# Patient Record
Sex: Male | Born: 1990 | Race: White | Hispanic: No | Marital: Married | Smoking: Never smoker
Health system: Southern US, Community
[De-identification: ages and names within clinical notes are randomized; demographics above are authoritative.]

## PROBLEM LIST (undated history)

## (undated) DIAGNOSIS — F419 Anxiety disorder, unspecified: Secondary | ICD-10-CM

## (undated) DIAGNOSIS — I10 Essential (primary) hypertension: Secondary | ICD-10-CM

## (undated) DIAGNOSIS — M1990 Unspecified osteoarthritis, unspecified site: Secondary | ICD-10-CM

## (undated) DIAGNOSIS — N200 Calculus of kidney: Secondary | ICD-10-CM

## (undated) DIAGNOSIS — E559 Vitamin D deficiency, unspecified: Secondary | ICD-10-CM

## (undated) DIAGNOSIS — E785 Hyperlipidemia, unspecified: Secondary | ICD-10-CM

## (undated) DIAGNOSIS — G4730 Sleep apnea, unspecified: Secondary | ICD-10-CM

## (undated) DIAGNOSIS — M109 Gout, unspecified: Secondary | ICD-10-CM

## (undated) DIAGNOSIS — E781 Pure hyperglyceridemia: Secondary | ICD-10-CM

## (undated) HISTORY — DX: Gout, unspecified: M10.9

## (undated) HISTORY — DX: Calculus of kidney: N20.0

## (undated) HISTORY — DX: Anxiety disorder, unspecified: F41.9

## (undated) HISTORY — DX: Unspecified osteoarthritis, unspecified site: M19.90

## (undated) HISTORY — DX: Sleep apnea, unspecified: G47.30

## (undated) HISTORY — PX: APPENDECTOMY: SHX54

## (undated) HISTORY — DX: Vitamin D deficiency, unspecified: E55.9

## (undated) HISTORY — PX: NASAL SEPTUM SURGERY: SHX37

## (undated) HISTORY — DX: Pure hyperglyceridemia: E78.1

## (undated) HISTORY — PX: TURBINATE RESECTION: SHX293

---
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
## 2011-04-24 ENCOUNTER — Encounter: Payer: Self-pay | Admitting: Emergency Medicine

## 2011-04-24 ENCOUNTER — Emergency Department (INDEPENDENT_AMBULATORY_CARE_PROVIDER_SITE_OTHER)
Admission: EM | Admit: 2011-04-24 | Discharge: 2011-04-24 | Disposition: A | Discharge status: Home / Self Care | Source: Home / Self Care | Attending: Emergency Medicine | Admitting: Emergency Medicine

## 2011-04-24 DIAGNOSIS — J069 Acute upper respiratory infection, unspecified: Secondary | ICD-10-CM

## 2011-04-24 DIAGNOSIS — J029 Acute pharyngitis, unspecified: Secondary | ICD-10-CM

## 2011-04-24 HISTORY — DX: Essential (primary) hypertension: I10

## 2011-04-24 LAB — POCT RAPID STREP A (OFFICE): Rapid Strep A Screen: NEGATIVE

## 2011-04-24 MED ORDER — AZITHROMYCIN 250 MG PO TABS: ORAL_TABLET | ORAL | Status: AC

## 2011-04-24 NOTE — ED Provider Notes (Signed)
History     CSN: [id number]  Arrival date & time 04/24/11  1354   First MD Initiated Contact with Patient 04/24/11 1413      No chief complaint on file.   (Consider location/radiation/quality/duration/timing/severity/associated sxs/prior treatment) HPI Ricky Dalton is a 21 y.o. male who complains of onset of cold symptoms for 1 days.  Wife with strep throat. + sore throat No cough No pleuritic pain No wheezing No nasal congestion No post-nasal drainage No sinus pain/pressure No chest congestion No itchy/red eyes No earache No hemoptysis No SOB No chills/sweats No fever No nausea No vomiting No abdominal pain No diarrhea No skin rashes No fatigue No myalgias No headache    No past medical history on file.  No past surgical history on file.  No family history on file.  History  Substance Use Topics  . Smoking status: Not on file  . Smokeless tobacco: Not on file  . Alcohol Use: Not on file      Review of Systems  All other systems reviewed and are negative.    Allergies  Review of patient's allergies indicates not on file.  Home Medications  No current outpatient prescriptions on file.  There were no vitals taken for this visit.  Physical Exam  Nursing note and vitals reviewed. Constitutional: He is oriented to person, place, and time. He appears well-developed and well-nourished.  HENT:  Head: Normocephalic and atraumatic.  Right Ear: Tympanic membrane, external ear and ear canal normal.  Left Ear: Tympanic membrane, external ear and ear canal normal.  Nose: Mucosal edema and rhinorrhea present.  Mouth/Throat: Oropharyngeal exudate, posterior oropharyngeal edema and posterior oropharyngeal erythema present.  Eyes: No scleral icterus.  Neck: Neck supple.  Cardiovascular: Regular rhythm and normal heart sounds.   Pulmonary/Chest: Effort normal and breath sounds normal. No respiratory distress.  Neurological: He is alert and oriented to  person, place, and time.  Skin: Skin is warm and dry.  Psychiatric: He has a normal mood and affect. His speech is normal.    ED Course  Procedures (including critical care time)  Labs Reviewed - No data to display No results found.   No diagnosis found.    MDM  1)  Take the prescribed antibiotic as instructed.  The rapid strep is negative, however his throat appears swollen red and with a recent exposure to strep throat we'll want to treat him anyway. Throat culture is pending. 2)  Use nasal saline solution (over the counter) at least 3 times a day. 3)  Use over the counter decongestants like Zyrtec-D every 12 hours as needed to help with congestion.  If you have hypertension, do not take medicines with sudafed.  4)  Can take tylenol every 6 hours or motrin every 8 hours for pain or fever. 5)  Follow up with your primary doctor if no improvement in 5-7 days, sooner if increasing pain, fever, or new symptoms.     Lily Kocher, MD 04/24/11 [phone]

## 2011-04-24 NOTE — ED Notes (Signed)
Wife diagnosed with Strep throat yesterday; patient woke up with sore throat this a.m.

## 2011-04-25 LAB — STREP A DNA PROBE: GASP: NEGATIVE

## 2011-04-27 ENCOUNTER — Telehealth: Payer: Self-pay | Admitting: Emergency Medicine

## 2012-02-01 ENCOUNTER — Emergency Department (INDEPENDENT_AMBULATORY_CARE_PROVIDER_SITE_OTHER)
Admission: EM | Admit: 2012-02-01 | Discharge: 2012-02-01 | Disposition: A | Discharge status: Home / Self Care | Source: Home / Self Care | Attending: Family Medicine | Admitting: Family Medicine

## 2012-02-01 DIAGNOSIS — H9209 Otalgia, unspecified ear: Secondary | ICD-10-CM

## 2012-02-01 DIAGNOSIS — M26629 Arthralgia of temporomandibular joint, unspecified side: Secondary | ICD-10-CM

## 2012-02-01 DIAGNOSIS — H9202 Otalgia, left ear: Secondary | ICD-10-CM

## 2012-02-01 DIAGNOSIS — M2669 Other specified disorders of temporomandibular joint: Secondary | ICD-10-CM

## 2012-02-01 MED ORDER — PREDNISONE 20 MG PO TABS: 20.0000 mg | ORAL_TABLET | Freq: Two times a day (BID) | ORAL | Status: DC

## 2012-02-01 NOTE — ED Provider Notes (Signed)
History     CSN: [id number]  Arrival date & time 02/01/12  1816   First MD Initiated Contact with Patient 02/01/12 1829      Chief Complaint  Patient presents with  . Otalgia    x 2 days      HPI Comments: Ricky Dalton complains of pain and decreased hearing in his left ear for 2 days. It started with ringing in his left ear. Denies fever, chills or sweats.  Patient is a 21 y.o. male presenting with ear pain. The history is provided by the patient.  Otalgia This is a new problem. The current episode started 2 days ago. There is pain in the left ear. The problem occurs constantly. The problem has not changed since onset.There has been no fever. The pain is mild. Associated symptoms include headaches and hearing loss. Pertinent negatives include no ear discharge, no rhinorrhea, no sore throat, no neck pain and no cough.    Past Medical History  Diagnosis Date  . Hypertension     History reviewed. No pertinent past surgical history.  Family History  Problem Relation Age of Onset  . Stroke Father   . Hypertension Father   . Stroke Paternal Uncle   . Hypertension Paternal Uncle     History  Substance Use Topics  . Smoking status: Never Smoker   . Smokeless tobacco: Not on file  . Alcohol Use: Yes      Review of Systems  HENT: Positive for hearing loss and ear pain. Negative for sore throat, rhinorrhea, neck pain and ear discharge.   Respiratory: Negative for cough.   Neurological: Positive for headaches.   No sore throat No cough No pleuritic pain No wheezing No nasal congestion No post-nasal drainage No sinus pain/pressure No itchy/red eyes + left earache No hemoptysis No SOB No fever/chills No nausea No vomiting No abdominal pain No diarrhea No urinary symptoms No skin rashes No fatigue No myalgias + headache Used OTC meds without relief  Allergies  Review of patient's allergies indicates no known allergies.  Home Medications   Current  Outpatient Rx  Name  Route  Sig  Dispense  Refill  . PREDNISONE 20 MG PO TABS   Oral   Take 1 tablet (20 mg total) by mouth 2 (two) times daily.   10 tablet   0     BP 131/89  Pulse 88  Temp 97.9 F (36.6 C) (Oral)  Resp 18  Ht 6\' 2"  (1.88 m)  Wt 207 lb (93.895 kg)  BMI 26.58 kg/m2  SpO2 96%  Physical Exam Nursing notes and Vital Signs reviewed. Appearance:  Patient appears healthy, stated age, and in no acute distress Eyes:  Pupils are equal, round, and reactive to light and accomodation.  Extraocular movement is intact.  Conjunctivae are not inflamed  Ears:  Canals normal.  Tympanic membranes normal. There is distinct tenderness over the left TMJ Nose:  Mildly congested turbinates.  No sinus tenderness.    Pharynx:  Normal Neck:  Supple.   No adenopathy Lungs:  Clear to auscultation.  Breath sounds are equal.  Heart:  Regular rate and rhythm without murmurs, rubs, or gallops.  Skin:  No rash present.   ED Course  Procedures none  Labs Reviewed - Tympanogram normal both ears    1. Otalgia of left ear; no evidence of otitis media  2. TMJ pain dysfunction syndrome       MDM  Begin prednisone burst. Take Mucinex D (guaifenesin with decongestant)  twice daily for congestion.  Increase fluid intake, rest. May use Afrin nasal spray (or generic oxymetazoline) twice daily for about 5 days.  Also recommend using saline nasal spray several times daily and saline nasal irrigation (AYR is a common brand) Followup with ENT if not improving.  Also recommend dental evaluation Given a [facility] patient information and instruction sheet on topic         Lattie Haw, MD 02/02/12 1454

## 2012-02-01 NOTE — ED Notes (Signed)
Ricky Dalton complains of pain and decreased hearing in his left ear for 2 days. It started with ringing in his left ear. Denies fever, chills or sweats.

## 2012-02-06 ENCOUNTER — Telehealth: Payer: Self-pay | Admitting: Emergency Medicine

## 2012-12-05 ENCOUNTER — Emergency Department (INDEPENDENT_AMBULATORY_CARE_PROVIDER_SITE_OTHER)
Admission: EM | Admit: 2012-12-05 | Discharge: 2012-12-05 | Disposition: A | Discharge status: Home / Self Care | Source: Home / Self Care | Attending: Family Medicine | Admitting: Family Medicine

## 2012-12-05 DIAGNOSIS — J069 Acute upper respiratory infection, unspecified: Secondary | ICD-10-CM

## 2012-12-05 DIAGNOSIS — R062 Wheezing: Secondary | ICD-10-CM

## 2012-12-05 DIAGNOSIS — J209 Acute bronchitis, unspecified: Secondary | ICD-10-CM

## 2012-12-05 MED ORDER — METHYLPREDNISOLONE ACETATE 80 MG/ML IJ SUSP
80.0000 mg | Freq: Once | INTRAMUSCULAR | Status: AC
  Administered 2012-12-05: 80 mg via INTRAMUSCULAR

## 2012-12-05 MED ORDER — HYDROCOD POLST-CHLORPHEN POLST 10-8 MG/5ML PO LQCR: 5.0000 mL | Freq: Two times a day (BID) | ORAL | Status: DC | PRN

## 2012-12-05 MED ORDER — AZITHROMYCIN 250 MG PO TABS: ORAL_TABLET | ORAL | Status: DC

## 2012-12-05 NOTE — ED Notes (Signed)
Patient complains of cough, shortness of breath and hoarseness for 2 weeks.

## 2012-12-05 NOTE — ED Provider Notes (Signed)
CSN: [id number]     Arrival date & time 12/05/12  1713 History   First MD Initiated Contact with Patient 12/05/12 1723     Chief Complaint  Patient presents with  . Cough    x 2 weeks  . Hoarse    x 2 weeks  . Shortness of Breath    x 2 weeks    HPI  URI Symptoms Onset: 1-2 weeks Description: rhinorrhea, nasal congestion, cough, SOB, wheezing Modifying factors:  none  Symptoms Nasal discharge: yes Fever: no Sore throat: no Cough: yes Wheezing: yes Ear pain: no GI symptoms: no Sick contacts: yes  Red Flags  Stiff neck: no Dyspnea: minimal  Rash: no Swallowing difficulty: no  Sinusitis Risk Factors Headache/face pain: no Double sickening: no tooth pain: no  Allergy Risk Factors Sneezing: no Itchy scratchy throat: no Seasonal symptoms: no  Flu Risk Factors Headache: no muscle aches: no severe fatigue: no   Past Medical History  Diagnosis Date  . Hypertension    No past surgical history on file. Family History  Problem Relation Age of Onset  . Stroke Father   . Hypertension Father   . Stroke Paternal Uncle   . Hypertension Paternal Uncle    History  Substance Use Topics  . Smoking status: Never Smoker   . Smokeless tobacco: Not on file  . Alcohol Use: Yes    Review of Systems  All other systems reviewed and are negative.    Allergies  Review of patient's allergies indicates no known allergies.  Home Medications   Current Outpatient Rx  Name  Route  Sig  Dispense  Refill  . azithromycin (ZITHROMAX) 250 MG tablet      Take 2 tabs PO x 1 dose, then 1 tab PO QD x 4 days   6 tablet   0   . chlorpheniramine-HYDROcodone (TUSSIONEX PENNKINETIC ER) 10-8 MG/5ML LQCR   Oral   Take 5 mLs by mouth every 12 (twelve) hours as needed (cough).   60 mL   0   . predniSONE (DELTASONE) 20 MG tablet   Oral   Take 1 tablet (20 mg total) by mouth 2 (two) times daily.   10 tablet   0    BP 118/77  Pulse 88  Temp(Src) 98.7 F (37.1 C) (Oral)   Ht 6\' 2"  (1.88 m)  Wt 213 lb (96.616 kg)  BMI 27.34 kg/m2  SpO2 96% Physical Exam  Constitutional: He appears well-developed and well-nourished.  HENT:  Head: Normocephalic and atraumatic.  Right Ear: External ear normal.  Left Ear: External ear normal.  +nasal erythema, rhinorrhea bilaterally, + post oropharyngeal erythema    Eyes: Conjunctivae are normal. Pupils are equal, round, and reactive to light.  Neck: Normal range of motion. Neck supple.  Cardiovascular: Normal rate, regular rhythm and normal heart sounds.   Pulmonary/Chest: Effort normal.  Faint wheezes in bases   Abdominal: Soft.  Musculoskeletal: Normal range of motion.  Lymphadenopathy:    He has no cervical adenopathy.  Neurological: He is alert.  Skin: Skin is warm.    ED Course  Procedures (including critical care time) Labs Review Labs Reviewed - No data to display Imaging Review No results found.  MDM   1. URI (upper respiratory infection)   2. Acute bronchitis   3. Wheezing    Depomedrol 80mg  IM x1 Tussionex for cough.  Discussed imaging with pt. Will defer. No increased WOB or SOB. Discussed red flags that would warrant imaging including progressive  SOB.  Zpak for atypical/bronchitis  coverage.  Discussed infectious and resp red flags.  Follow up as needed.     The patient and/or caregiver has been counseled thoroughly with regard to treatment plan and/or medications prescribed including dosage, schedule, interactions, rationale for use, and possible side effects and they verbalize understanding. Diagnoses and expected course of recovery discussed and will return if not improved as expected or if the condition worsens. Patient and/or caregiver verbalized understanding.         Doree Albee, MD 12/05/12 [phone]

## 2014-12-06 ENCOUNTER — Emergency Department (INDEPENDENT_AMBULATORY_CARE_PROVIDER_SITE_OTHER): Payer: Self-pay

## 2014-12-06 ENCOUNTER — Emergency Department
Admission: EM | Admit: 2014-12-06 | Discharge: 2014-12-06 | Disposition: A | Discharge status: Home / Self Care | Source: Home / Self Care | Attending: Emergency Medicine | Admitting: Emergency Medicine

## 2014-12-06 ENCOUNTER — Encounter: Payer: Self-pay | Admitting: *Deleted

## 2014-12-06 DIAGNOSIS — R058 Other specified cough: Secondary | ICD-10-CM

## 2014-12-06 DIAGNOSIS — J189 Pneumonia, unspecified organism: Secondary | ICD-10-CM

## 2014-12-06 DIAGNOSIS — R05 Cough: Secondary | ICD-10-CM

## 2014-12-06 DIAGNOSIS — R509 Fever, unspecified: Secondary | ICD-10-CM

## 2014-12-06 DIAGNOSIS — R0989 Other specified symptoms and signs involving the circulatory and respiratory systems: Secondary | ICD-10-CM

## 2014-12-06 MED ORDER — PREDNISONE 50 MG PO TABS: ORAL_TABLET | ORAL | Status: DC

## 2014-12-06 MED ORDER — PROMETHAZINE-CODEINE 6.25-10 MG/5ML PO SYRP
ORAL_SOLUTION | ORAL | Status: DC
Start: 2014-12-06 — End: 2017-09-13

## 2014-12-06 MED ORDER — CEFTRIAXONE SODIUM 1 G IJ SOLR
1000.0000 mg | Freq: Once | INTRAMUSCULAR | Status: AC
  Administered 2014-12-06: 1000 mg via INTRAMUSCULAR

## 2014-12-06 MED ORDER — AZITHROMYCIN 250 MG PO TABS: ORAL_TABLET | ORAL | Status: DC

## 2014-12-06 NOTE — ED Notes (Signed)
Pt c/o productive cough with chest soreness, congestion, runny nose x 3 days. Afebrile. Taken Sudafed and Nyquil otc.

## 2014-12-06 NOTE — Discharge Instructions (Signed)
Although chest x-ray shows no definite pneumonia, on physical exam lungs sound severely congested similar to pneumonia, so we are treating aggressively with antibiotic shot and antibiotic medication by mouth.   Community-Acquired Pneumonia, Adult Pneumonia is an infection of the lungs. One type of pneumonia can happen while a person is in a hospital. A different type can happen when a person is not in a hospital (community-acquired pneumonia). It is easy for this kind to spread from person to person. It can spread to you if you breathe near an infected person who coughs or sneezes. Some symptoms include:  A dry cough.  A wet (productive) cough.  Fever.  Sweating.  Chest pain. HOME CARE  Take over-the-counter and prescription medicines only as told by your doctor.  Only take cough medicine if you are losing sleep.  If you were prescribed an antibiotic medicine, take it as told by your doctor. Do not stop taking the antibiotic even if you start to feel better.  Sleep with your head and neck raised (elevated). You can do this by putting a few pillows under your head, or you can sleep in a recliner.  Do not use tobacco products. These include cigarettes, chewing tobacco, and e-cigarettes. If you need help quitting, ask your doctor.  Drink enough water to keep your pee (urine) clear or pale yellow. A shot (vaccine) can help prevent pneumonia. Shots are often suggested for:  People older than 26 years of age.  People older than 26 years of age:  Who are having cancer treatment.  Who have long-term (chronic) lung disease.  Who have problems with their body's defense system (immune system). You may also prevent pneumonia if you take these actions:  Get the flu (influenza) shot every year.  Go to the dentist as often as told.  Wash your hands often. If soap and water are not available, use hand sanitizer. GET HELP IF:  You have a fever.  You lose sleep because your cough  medicine does not help. GET HELP RIGHT AWAY IF:  You are short of breath and it gets worse.  You have more chest pain.  Your sickness gets worse. This is very serious if:  You are an older adult.  Your body's defense system is weak.  You cough up blood.   This information is not intended to replace advice given to you by your health care provider. Make sure you discuss any questions you have with your health care provider.   Document Released: 08/05/2007 Document Revised: 11/07/2014 Document Reviewed: 06/13/2014 Elsevier Interactive Patient Education [employer]

## 2014-12-06 NOTE — ED Provider Notes (Signed)
CSN: [id number]     Arrival date & time 12/06/14  7829 History   First MD Initiated Contact with Patient 12/06/14 [phone]     Chief Complaint  Patient presents with  . Cough   (Consider location/radiation/quality/duration/timing/severity/associated sxs/prior Treatment) Patient is a 24 y.o. Dalton presenting with cough. The history is provided by the patient.  Cough Cough characteristics:  Productive Sputum characteristics:  Yellow Severity:  Severe Onset quality:  Gradual Duration:  3 days Timing:  Intermittent Progression:  Worsening Chronicity:  New Smoker: no   Context: sick contacts (Young son with URI)   Relieved by:  Nothing Worsened by:  Lying down Ineffective treatments: NyQuil, Sudafed. Associated symptoms: chills, rhinorrhea, shortness of breath and sinus congestion   Associated symptoms: no wheezing   Associated symptoms comment:  No documented fever, but feels hot and cold Risk factors: no recent travel    He denies any significant sore throat. Has minimal nausea, may have vomited once but tolerating by mouth's otherwise. Past Medical History  Diagnosis Date  . Hypertension    Past Surgical History  Procedure Laterality Date  . Appendectomy    . Nasal septum surgery     Family History  Problem Relation Age of Onset  . Stroke Father   . Hypertension Father   . Stroke Paternal Uncle   . Hypertension Paternal Uncle    Social History  Substance Use Topics  . Smoking status: Never Smoker   . Smokeless tobacco: None  . Alcohol Use: Yes    Review of Systems  Constitutional: Positive for chills.  HENT: Positive for rhinorrhea.   Respiratory: Positive for cough and shortness of breath. Negative for wheezing.   All other systems reviewed and are negative.   Allergies  Review of patient's allergies indicates no known allergies.  Home Medications   Prior to Admission medications   Medication Sig Start Date End Date Taking? Authorizing Provider   azithromycin (ZITHROMAX Z-PAK) 250 MG tablet Take 2 tablets on day one, then 1 tablet daily on days 2 through 5 12/06/14   Lajean Manes, MD  predniSONE (DELTASONE) 50 MG tablet Take one tablet by mouth daily with food for 3 days 12/06/14   Lajean Manes, MD  promethazine-codeine [facility] WITH CODEINE) 6.25-10 MG/5ML syrup Take 1-2 teaspoons every 4-6 hours as needed for cough. May cause drowsiness. 12/06/14   Lajean Manes, MD   Meds Ordered and Administered this Visit   Medications  cefTRIAXone (ROCEPHIN) injection 1,000 mg (1,000 mg Intramuscular Given 12/06/14 0943)    BP 123/83 mmHg  Pulse 99  Temp(Src) 99.3 F (37.4 C) (Oral)  Resp 16  Wt 220 lb (99.791 kg)  SpO2 97% No data found.   Physical Exam  Constitutional: He is oriented to person, place, and time. He appears well-developed and well-nourished. No distress.  Appears fatigued. Hacking cough noted  HENT:  Head: Normocephalic and atraumatic.  Right Ear: Tympanic membrane normal.  Left Ear: Tympanic membrane normal.  Mouth/Throat: No oropharyngeal exudate.  Nose: Seromucoid drainage Oropharynx:mucus membranes moist. Minimal injection. Mild postnasal drainage.  Airway intact  Eyes: Conjunctivae are normal. Right eye exhibits no discharge. Left eye exhibits no discharge. No scleral icterus.  Neck: Neck supple. No JVD present.  Cardiovascular: Normal rate, regular rhythm and normal heart sounds.   Pulmonary/Chest: No respiratory distress. He has no wheezes. He has rhonchi.  Harsh moist rhonchi bilaterally in all lung fields. Hacking cough. Questionable bibasilar rales. Breath sounds equal bilaterally. No wheezes heard.  Abdominal: Soft.  He exhibits no distension.  Musculoskeletal: He exhibits no edema.  Lymphadenopathy:    He has no cervical adenopathy.  Neurological: He is alert and oriented to person, place, and time. No cranial nerve deficit.  Skin: Skin is warm and dry. No rash noted.  Nursing note and vitals  reviewed.   [facility] Course Chest x-ray ordered . Patient agrees with ordering chest x-ray . FINDINGS: The cardiac silhouette, mediastinal and hilar contours are within normal limits. Mild peribronchial thickening and slight increased interstitial markings could suggest bronchitis. No infiltrates, edema or effusions. The bony thorax is intact.  IMPRESSION: Possible mild bronchitis but no infiltrates or effusions.  Electronically Signed  By: Rudie Meyer M.D.  On: 12/06/2014 09:18   Procedures (including critical care time)  MDM Although chest x-ray shows acute bronchitis, on physical exam, he clinically has early acute pneumonia,bibasilar rales, so we'll treat aggressively .   Discussed with patient, who agrees.   Treatment options discussed, as well as risks, benefits, alternatives. Patient voiced understanding and agreement with the following plans: Medications  cefTRIAXone (ROCEPHIN) injection 1,000 mg (1,000 mg Intramuscular Given 12/06/14 0943)   Discharge Medication List as of 12/06/2014  9:46 AM    START taking these medications   Details  azithromycin (ZITHROMAX Z-PAK) 250 MG tablet Take 2 tablets on day one, then 1 tablet daily on days 2 through 5, Print    predniSONE (DELTASONE) 50 MG tablet Take one tablet by mouth daily with food for 3 days, Print    promethazine-codeine (PHENERGAN WITH CODEINE) 6.25-10 MG/5ML syrup Take 1-2 teaspoons every 4-6 hours as needed for cough. May cause drowsiness., Print       Three days po Prednisone burst to help with hacking,inflammatory cough.     1. CAP (community acquired pneumonia)   2. Productive cough    See also detailed Instructions in AVS, which were given to patient. Follow-up with your primary care doctor in 5-7 days if not improving, or sooner if symptoms become worse. Precautions discussed. Red flags discussed. Questions invited and answered. Patient voiced understanding and agreement.    Lajean Manes,  MD 12/09/14 [phone]

## 2017-09-13 ENCOUNTER — Encounter: Payer: Self-pay | Admitting: *Deleted

## 2017-09-13 ENCOUNTER — Emergency Department
Admission: EM | Admit: 2017-09-13 | Discharge: 2017-09-13 | Disposition: A | Discharge status: Home / Self Care | Payer: Self-pay | Source: Home / Self Care | Attending: Family Medicine | Admitting: Family Medicine

## 2017-09-13 DIAGNOSIS — I10 Essential (primary) hypertension: Secondary | ICD-10-CM

## 2017-09-13 HISTORY — DX: Hyperlipidemia, unspecified: E78.5

## 2017-09-13 LAB — POCT URINALYSIS DIP (MANUAL ENTRY)
Bilirubin, UA: NEGATIVE
Glucose, UA: NEGATIVE mg/dL
Ketones, POC UA: NEGATIVE mg/dL
Leukocytes, UA: NEGATIVE
NITRITE UA: NEGATIVE
Spec Grav, UA: >=1.03 — AB (ref 1.010–1.025)
Urobilinogen, UA: 1 E.U./dL
pH, UA: 6 (ref 5.0–8.0)

## 2017-09-13 MED ORDER — AMLODIPINE BESYLATE 5 MG PO TABS: ORAL_TABLET | ORAL | 1 refills | Status: DC

## 2017-09-13 NOTE — ED Provider Notes (Signed)
[facility]    CSN: [id number] Arrival date & time: 09/13/17  1948     History   Chief Complaint Chief Complaint  Patient presents with  . Epistaxis  . Blurred Vision  . Hypertension    HPI Ricky Dalton is a 27 y.o. male.   Patient is concerned about his BP which has been elevated for "a while."  He has developed more frequent nosebleeds.  Today he experienced blurred vision and checked his BP at 169/96.  He denies chest pain or shortness of breath. He states that he has been diagnosed in the past with gout. He does not smoke or use alcohol. He has multiple relatives with hypertension.  The history is provided by the patient.    Past Medical History:  Diagnosis Date  . Hyperlipidemia   . Hypertension     There are no active problems to display for this patient.   Past Surgical History:  Procedure Laterality Date  . APPENDECTOMY    . NASAL SEPTUM SURGERY         Home Medications    Prior to Admission medications   Medication Sig Start Date End Date Taking? Authorizing Provider  amLODipine (NORVASC) 5 MG tablet Take one tab by mouth once daily for blood pressure 09/13/17   Lattie HawBeese, Stephen A, MD    Family History Family History  Problem Relation Age of Onset  . Stroke Father   . Hypertension Father   . Heart disease Father   . Stroke Paternal Uncle   . Hypertension Paternal Uncle   . Heart disease Sister     Social History Social History   Tobacco Use  . Smoking status: Never Smoker  . Smokeless tobacco: Never Used  Substance Use Topics  . Alcohol use: Yes  . Drug use: No     Allergies   Patient has no known allergies.   Review of Systems Review of Systems  Constitutional: Negative.   HENT: Positive for nosebleeds.   Eyes: Positive for visual disturbance.  Respiratory: Negative.   Cardiovascular: Negative.   Gastrointestinal: Negative.   Genitourinary: Negative.   Musculoskeletal: Negative.   Skin: Negative.     Neurological: Positive for headaches.     Physical Exam Triage Vital Signs ED Triage Vitals  Enc Vitals Group     BP 09/13/17 2009 (!) 159/95     Pulse Rate 09/13/17 2009 83     Resp 09/13/17 2009 18     Temp 09/13/17 2009 98.2 F (36.8 C)     Temp Source 09/13/17 2009 Oral     SpO2 09/13/17 2009 99 %     Weight 09/13/17 2010 230 lb (104.3 kg)     Height 09/13/17 2010 6\' 2"  (1.88 m)     Head Circumference --      Peak Flow --      Pain Score 09/13/17 2010 0     Pain Loc --      Pain Edu? --      Excl. in GC? --    No data found.  Updated Vital Signs BP (!) 159/95 (BP Location: Left Arm)   Pulse 83   Temp 98.2 F (36.8 C) (Oral)   Resp 18   Ht 6\' 2"  (1.88 m)   Wt 230 lb (104.3 kg)   SpO2 99%   BMI 29.53 kg/m   Visual Acuity Right Eye Distance:   Left Eye Distance:   Bilateral Distance:    Right Eye Near:  Left Eye Near:    Bilateral Near:     Physical Exam Nursing notes and Vital Signs reviewed. Appearance:  Patient appears stated age, and in no acute distress Eyes:  Pupils are equal, round, and reactive to light and accomodation.  Extraocular movement is intact.  Conjunctivae are not inflamed.  Fundi benign. Ears:  Canals normal.  Tympanic membranes normal.  Nose:  Mildly congested turbinates; no evidence epistaxis at present.  No sinus tenderness.  Pharynx:  Normal Neck:  Supple.  No adenopathy or thyromegaly.  Carotids have normal upstrokes without bruits.  Lungs:  Clear to auscultation.  Breath sounds are equal.  Moving air well. Heart:  Regular rate and rhythm without murmurs, rubs, or gallops.  Abdomen:  Nontender without masses or hepatosplenomegaly.  Bowel sounds are present.  No CVA or flank tenderness.  Extremities:  No edema. Peripheral pulses intact. Skin:  No rash present.    UC Treatments / Results  Labs (all labs ordered are listed, but only abnormal results are displayed) Labs Reviewed  POCT URINALYSIS DIP (MANUAL ENTRY) - Abnormal;  Notable for the following components:      Result Value   Spec Grav, UA >=1.030 (*)    Blood, UA trace-intact (*)    Protein Ur, POC =30 (*)    All other components within normal limits  CBC WITH DIFFERENTIAL/PLATELET  COMPREHENSIVE METABOLIC PANEL    EKG None  Radiology No results found.  Procedures Procedures (including critical care time)  Medications Ordered in UC Medications - No data to display  Initial Impression / Assessment and Plan / UC Course  I have reviewed the triage vital signs and the nursing notes.  Pertinent labs & imaging results that were available during my care of the patient were reviewed by me and considered in my medical decision making (see chart for details).    With a history of possible gout, will begin calcium channel blocker rather than diuretic. Note presence of proteinuria. Begin amlodipine 5mg  daily. CMP, CBC, pending. Followup with Family Doctor in about 2 weeks.   Final Clinical Impressions(s) / UC Diagnoses   Final diagnoses:  Essential hypertension     Discharge Instructions     Monitor blood pressure more frequently at different times of day and record on a calendar.     ED Prescriptions    Medication Sig Dispense Auth. Provider   amLODipine (NORVASC) 5 MG tablet Take one tab by mouth once daily for blood pressure 15 tablet Lattie Haw, MD        Lattie Haw, MD 09/17/17 1230

## 2017-09-13 NOTE — ED Triage Notes (Signed)
Pt c/o elevated blood pressure for "a while" and blurred vision and epistaxis x 2 days. He has an appt with a PCP on 09/27/17.

## 2017-09-13 NOTE — Discharge Instructions (Addendum)
Monitor blood pressure more frequently at different times of day and record on a calendar. °

## 2017-09-14 ENCOUNTER — Telehealth: Payer: Self-pay

## 2017-09-14 LAB — COMPREHENSIVE METABOLIC PANEL
AG Ratio: 2 (calc) (ref 1.0–2.5)
ALT: 48 U/L — AB (ref 9–46)
AST: 29 U/L (ref 10–40)
Albumin: 4.8 g/dL (ref 3.6–5.1)
Alkaline phosphatase (APISO): 56 U/L (ref 40–115)
BUN: 14 mg/dL (ref 7–25)
CALCIUM: 9.5 mg/dL (ref 8.6–10.3)
CHLORIDE: 103 mmol/L (ref 98–110)
CO2: 24 mmol/L (ref 20–32)
Creat: 1.01 mg/dL (ref 0.60–1.35)
Globulin: 2.4 g/dL (calc) (ref 1.9–3.7)
Glucose, Bld: 59 mg/dL — ABNORMAL LOW (ref 65–99)
Potassium: 3.7 mmol/L (ref 3.5–5.3)
Sodium: 141 mmol/L (ref 135–146)
Total Bilirubin: 0.8 mg/dL (ref 0.2–1.2)
Total Protein: 7.2 g/dL (ref 6.1–8.1)

## 2017-09-14 LAB — CBC WITH DIFFERENTIAL/PLATELET
BASOS ABS: 90 {cells}/uL (ref 0–200)
Basophils Relative: 0.9 %
EOS ABS: 320 {cells}/uL (ref 15–500)
EOS PCT: 3.2 %
HCT: 43.5 % (ref 38.5–50.0)
HEMOGLOBIN: 15.4 g/dL (ref 13.2–17.1)
LYMPHS ABS: 4460 {cells}/uL — AB (ref 850–3900)
MCH: 30.3 pg (ref 27.0–33.0)
MCHC: 35.4 g/dL (ref 32.0–36.0)
MCV: 85.6 fL (ref 80.0–100.0)
MPV: 10.6 fL (ref 7.5–12.5)
Monocytes Relative: 9.3 %
Neutro Abs: 4200 cells/uL (ref 1500–7800)
Neutrophils Relative %: 42 %
Platelets: 203 10*3/uL (ref 140–400)
RBC: 5.08 10*6/uL (ref 4.20–5.80)
RDW: 13.3 % (ref 11.0–15.0)
Total Lymphocyte: 44.6 %
WBC mixed population: 930 cells/uL (ref 200–950)
WBC: 10 10*3/uL (ref 3.8–10.8)

## 2017-09-14 LAB — SPECIMEN COMPROMISED

## 2017-09-14 NOTE — Telephone Encounter (Signed)
Notified of lab results.  Has appointment to follow up next door.

## 2017-09-27 ENCOUNTER — Ambulatory Visit (INDEPENDENT_AMBULATORY_CARE_PROVIDER_SITE_OTHER): Payer: Self-pay | Admitting: Physician Assistant

## 2017-09-27 ENCOUNTER — Encounter: Payer: Self-pay | Admitting: Physician Assistant

## 2017-09-27 VITALS — BP 120/72 | HR 71 | Wt 228.0 lb

## 2017-09-27 DIAGNOSIS — Z7689 Persons encountering health services in other specified circumstances: Secondary | ICD-10-CM

## 2017-09-27 DIAGNOSIS — R809 Proteinuria, unspecified: Secondary | ICD-10-CM | POA: Insufficient documentation

## 2017-09-27 DIAGNOSIS — I10 Essential (primary) hypertension: Secondary | ICD-10-CM | POA: Insufficient documentation

## 2017-09-27 DIAGNOSIS — R3129 Other microscopic hematuria: Secondary | ICD-10-CM | POA: Insufficient documentation

## 2017-09-27 DIAGNOSIS — N522 Drug-induced erectile dysfunction: Secondary | ICD-10-CM | POA: Insufficient documentation

## 2017-09-27 DIAGNOSIS — R800 Isolated proteinuria: Secondary | ICD-10-CM

## 2017-09-27 DIAGNOSIS — N069 Isolated proteinuria with unspecified morphologic lesion: Secondary | ICD-10-CM | POA: Insufficient documentation

## 2017-09-27 LAB — POCT UA - MICROALBUMIN
Creatinine, POC: 300 mg/dL
Microalbumin Ur, POC: 150 mg/L

## 2017-09-27 MED ORDER — LISINOPRIL 20 MG PO TABS: 20.0000 mg | ORAL_TABLET | Freq: Every day | ORAL | 5 refills | Status: DC

## 2017-09-27 NOTE — Patient Instructions (Addendum)
For your blood pressure: - Goal <130/80 - start Lisinopril 20 mg every morning - monitor and log blood pressures at home - check around the same time each day in a relaxed setting - Limit salt to <2000 mg/day - Follow DASH eating plan - limit alcohol to 2 standard drinks per day for men and 1 per day for women - avoid tobacco products - weight loss: 7% of current body weight - follow-up every 6 months for your blood pressure   For your proteinuria - for your home urine test, avoid OTC pain relievers (NSAID's like Aleve, Advil, Motrin, Ibuprofen, Aspirin) - do not exercise during the 24 hour period - stay hydrated. Drink at least 1.5 L of water per day   Proteinuria Proteinuria is when there is too much protein in the urine. Proteins are important for buildingmuscles and bones. Proteins are also needed to fight infections, help the blood clot, and keep body fluids in balance. Proteinuria may be mild and temporary, or it may be an early sign of kidney disease. The kidneys make urine. Healthy kidneys also keep substances like proteins from leaving the blood and ending up in the urine. Proteinuria may be a sign that the kidneys are not working well. What are the causes? Healthy kidneys have filters (glomeruli) that keep proteins out of the urine. Proteinuria may mean that the glomeruli are damaged. The main causes of this type of damage are:  Diabetes.  High blood pressure.  Other causes of kidney damage can also cause proteinuria, such as:  Diseases of the immune system, such as lupus, rheumatoid arthritis, sarcoidosis, and Goodpasture syndrome.  Heart disease or heart failure.  Kidney infection.  Certain cancers, including kidney cancer, lymphoma, leukemia, and multiple myeloma.  Amyloidosis. This is a disease that causes abnormal proteins to build up in body tissues.  Reactions to certain medicines, such as NSAIDs.  Injury (trauma) or poisons (toxins).  High blood pressure  that occurs during pregnancy (preeclampsia).  Temporary proteinuria may result from conditions that put stress on the kidneys. These conditions usually do not cause kidney damage. They include:  Fever.  Exposure to cold or heat.  Emotional or physical stress.  Extreme exercise.  Standing for long periods of time.  What increases the risk? This condition is more likely to develop in people who:  Have diabetes.  Have high blood pressure.  Have heart disease or heart failure.  Have an immune disease, cancer, or other disease that affects the kidneys.  Have a family history of kidney disease.  Are 65 or older.  Are overweight.  Are of African-American, American [place], Hispanic/Latino, or [place] descent.  Are pregnant.  Have an infection.  What are the signs or symptoms? Mild proteinuria may not cause symptoms. As more proteins enter the urine, symptoms of kidney disease may develop, such as:  Foamy urine.  Swelling of the face, abdomen, hands, legs, or feet (edema).  Needing to urinate frequently.  Fatigue.  Difficulty sleeping.  Dry and itchy skin.  Nausea and vomiting.  Muscle cramps.  Shortness of breath.  How is this diagnosed? Your health care provider can diagnose proteinuria with a urine test. You may have this test as part of a routine physical or because you have symptoms of kidney disease or risk factors for kidney disease. You may also have:  Blood tests to measure the level of a certain substance (creatinine) that increases with kidney disease.  Imaging tests of your kidney, such as a  CT scan or an ultrasound, to look for signs of kidney damage.  How is this treated? If your proteinuria is mild or temporary, no treatment may be needed. Your health care provider may show you how to monitor the level of protein in your urine at home. Identifying proteinuria early is important so that the cause of the condition can be  treated. Treatment depends on the cause of your proteinuria. Treatment may include:  Making diet and lifestyle changes.  Getting blood pressure under control.  Getting blood sugar under control, if you have diabetes.  Managing any other medical conditions you have that affect your kidneys.  Giving birth, if you are pregnant.  Avoiding medicines that damage your kidneys.  Treating kidney disease with medicine and dialysis, as needed.  Follow these instructions at home:  Check your protein levels at home, if directed by your health care provider.  Follow instructions from your health care provider about eating or drinking restrictions.  Take over-the-counter and prescription medicines only as told by your health care provider.  Return to your normal activities as told by your health care provider. Ask your health care provider what activities are safe for you.  If you are overweight, ask your health care provider to help you with a diet to get to a healthy weight.  Ask your health care provider to help you with an exercise program.  Keep all follow-up visits as told by your health care provider. This is important. Contact a health care provider if:  You have new symptoms.  Your symptoms get worse or do not improve. Get help right away if:  You have back pain.  You have diarrhea.  You vomit.  You have a fever.  You have a rash. This information is not intended to replace advice given to you by your health care provider. Make sure you discuss any questions you have with your health care provider. Document Released: 04/08/2005 Document Revised: 03/29/2015 Document Reviewed: 01/07/2015 Elsevier Interactive Patient Education  [employer].

## 2017-09-27 NOTE — Progress Notes (Signed)
Subjective:    Patient ID: Ricky Dalton, male    DOB: 09/10/1990, 27 y.o.   MRN: [id number]  HPI  Pt is a 27 yr old male w/ a hx of GAD, gout, RA and HTN presenting to the clinic today to establish care. Pt was in the ED (09/20/17) for elevated BP and possible anxiety attack. No intervention was done and was discharged once BP improved. Pt states recently being put on Norvasc (09/13/2017) at the urgent after having blurry vision and nose bleed. Pt states he checks his BP 3x a day. His BP consistently ranges in the 140/110 range. Pt reports hx of HTN on his father's side. Denies family hx of Kidney disease.  Erectile dysfunction: Pt states having erectile dysfunction since being put on the amlodipine. Pt states he is currently trying to have another kid. He is requesting to try another blood pressure medicine. Denies CP, palpitation, blurry vision, hematuria, nose bleeds, or any other symptoms.   GAD: Pt states he was on Wellbutrin in the past under the [facility]. He is currently not on any medicine and is willing to try something with the least side effect profile. Denies SI/HI.   RA: reports this was diagnosed at the [facility] approx a few years ago. Both knees are affected.  GAD 7 : Generalized Anxiety Score 09/27/2017  Nervous, Anxious, on Edge 3  Control/stop worrying 3  Worry too much - different things 3  Trouble relaxing 2  Restless 2  Easily annoyed or irritable 3  Afraid - awful might happen 3  Total GAD 7 Score 19  Anxiety Difficulty Very difficult   Depression screen PHQ 2/9 09/27/2017  Decreased Interest 1  Down, Depressed, Hopeless 1  PHQ - 2 Score 2  Altered sleeping 3  Tired, decreased energy 0  Change in appetite 0  Feeling bad or failure about yourself  0  Trouble concentrating 1  Moving slowly or fidgety/restless 0  Suicidal thoughts 0  PHQ-9 Score 6  Difficult doing work/chores Somewhat difficult   Vitals:   09/27/17 0919  BP: 120/72  Pulse: 71    Review of  Systems  Constitutional: Negative.  Negative for chills, diaphoresis, fatigue and fever.  HENT: Negative.   Respiratory: Negative.  Negative for cough and shortness of breath.   Cardiovascular: Negative for chest pain and palpitations.  Genitourinary: Negative for frequency and hematuria.       + erectile dysfunction  Skin: Negative.   Neurological: Negative.   Psychiatric/Behavioral:       + anxiety  All other systems reviewed and are negative.      Objective:   Physical Exam  Constitutional: He appears well-developed and well-nourished. No distress.  HENT:  Head: Normocephalic and atraumatic.  Eyes: Pupils are equal, round, and reactive to light. Conjunctivae and EOM are normal.  Neck: Normal range of motion.  Cardiovascular: Normal rate, regular rhythm and normal heart sounds.  Pulmonary/Chest: Effort normal and breath sounds normal.  Musculoskeletal: Normal range of motion. He exhibits no edema.  Neurological: He is alert.  Skin: Skin is warm and dry.       Assessment & Plan:  Marland Kitchen.Marland Kitchen.Cristal DeerChristopher was seen today for establish care.  Diagnoses and all orders for this visit:  Encounter to establish care  Isolated proteinuria without specific morphologic lesion -     Urinalysis, microscopic only -     POCT UA - Microalbumin  Other microscopic hematuria -     Urinalysis, microscopic only  Drug-induced erectile  dysfunction  Hypertension goal BP (blood pressure) < 130/80 -     lisinopril (PRINIVIL,ZESTRIL) 20 MG tablet; Take 1 tablet (20 mg total) by mouth daily.  Microalbuminuria -     Protein, Urine, 24 hour   - Isolated proteinuria: Pt has two separate readings of proteinuria both today and at urgent care. Urine microalbumin also abnormal today. Concern for nephritis. May also be related to RA. Will order microscopic UA and 24-hr urine protein. If proteinuria persists on 24 hr test we will refer to nephrology. Denies family hx kidney disease. Instructed to avoid NSAIDs  and drink 1.5 oz of water daily. Pt started on Lisinopril 20 mg. Pt voiced understanding.   - Switching from Alodipine 5 mg to Lisinopril 20 mg due to medication-induced ED. Discussed that uncontrolled HTN can also contribute to ED symptoms. Counseled on therapeutic lifestyle changes. Patient to monitor and log BP's at home. Bring BP cuff and readings to f/u appt in 2 weeks.   - Anxiety Disorder PHQ9=6 GAD7= 19, severe Discussed treatment options to include CBT, SSRI. Given ED symptoms and medication change today, we will defer on starting an SSRI and follow-up with him closely in 2 weeks  Patient education and anticipatory guidance given Patient agrees with treatment plan Follow-up in 2 weeks for HTN/GAD or sooner as needed

## 2017-09-28 LAB — URINALYSIS, MICROSCOPIC ONLY
Bacteria, UA: NONE SEEN /HPF
Hyaline Cast: NONE SEEN /LPF
Squamous Epithelial / LPF: NONE SEEN /HPF (ref <=5)
WBC UA: NONE SEEN /HPF (ref 0–5)

## 2017-10-01 LAB — PROTEIN, URINE, 24 HOUR: PROTEIN 24H UR: 525 mg/(24.h) — AB (ref 0–149)

## 2017-10-04 NOTE — Progress Notes (Signed)
Good morning,  The protein levels in your urine is consistent with hypertension and not an underlying kidney disease (nephritis). You do not need to follow-up with the kidney specialist. We just need to focus on controlling your blood pressure to <130/80

## 2017-10-18 ENCOUNTER — Encounter: Payer: Self-pay | Admitting: Physician Assistant

## 2017-10-18 ENCOUNTER — Ambulatory Visit (INDEPENDENT_AMBULATORY_CARE_PROVIDER_SITE_OTHER): Payer: Self-pay | Admitting: Physician Assistant

## 2017-10-18 VITALS — BP 128/85 | HR 66 | Wt 228.0 lb

## 2017-10-18 DIAGNOSIS — I10 Essential (primary) hypertension: Secondary | ICD-10-CM

## 2017-10-18 DIAGNOSIS — F419 Anxiety disorder, unspecified: Secondary | ICD-10-CM | POA: Insufficient documentation

## 2017-10-18 MED ORDER — ESCITALOPRAM OXALATE 10 MG PO TABS: ORAL_TABLET | ORAL | 1 refills | Status: DC

## 2017-10-18 NOTE — Progress Notes (Signed)
HPI:                                                                Ricky HarpChristopher T Dalton is a 27 y.o. male [facility] today for hypertension follow-up  Diagnosed with severe asymptomatic hypertension at last office visit 2 weeks ago. UA showed proteinuria, 24 hr protein level 525, normal renal function. He was switched from Norvasc to  Lisinopril 20 mg daily due to ED. Reports ED symptoms have resolved. Denies vision change, headache, chest pain with exertion, orthopnea, lightheadedness, syncope and edema. Risk factors include: male sex, family history   GAD: He's interested in starting anxiety medication today. Endorses excessive worry, restlessness, trouble sleeping. Has been on Wellbutrin in the past.   Depression screen [facility] 2/[facility] 09/27/2017  Decreased Interest 1  Down, Depressed, Hopeless 1  PHQ - 2 Score 2  Altered sleeping 3  Tired, decreased energy 0  Change in appetite 0  Feeling bad or failure about yourself  0  Trouble concentrating 1  Moving slowly or fidgety/restless 0  Suicidal thoughts 0  PHQ-9 Score 6  Difficult doing work/chores Somewhat difficult    GAD 7 : Generalized Anxiety Score 09/27/2017  Nervous, Anxious, on Edge 3  Control/stop worrying 3  Worry too much - different things 3  Trouble relaxing 2  Restless 2  Easily annoyed or irritable 3  Afraid - awful might happen 3  Total GAD 7 Score 19  Anxiety Difficulty Very difficult      Past Medical History:  Diagnosis Date  . Arthritis   . Gout   . Hyperlipidemia   . Hypertension    Past Surgical History:  Procedure Laterality Date  . APPENDECTOMY    . NASAL SEPTUM SURGERY     Social History   Tobacco Use  . Smoking status: Never Smoker  . Smokeless tobacco: Former NeurosurgeonUser    Types: Chew  Substance Use Topics  . Alcohol use: Yes    Alcohol/week: 1.0 standard drinks    Types: 1 Standard drinks or equivalent per week   family  history includes Diabetes in his father; Heart disease in his father and sister; Hypertension in his father and paternal uncle; Stroke in his father and paternal uncle.    ROS: negative except as noted in the HPI  Medications: Current Outpatient Medications  Medication Sig Dispense Refill  . lisinopril (PRINIVIL,ZESTRIL) 20 MG tablet Take 1 tablet (20 mg total) by mouth daily. 30 tablet 5  . escitalopram (LEXAPRO) 10 MG tablet Take 0.5 tablets (5 mg total) by mouth at bedtime for 3 days, THEN 1 tablet (10 mg total) at bedtime for 7 days, THEN 2 tablets (20 mg total) at bedtime for 20 days. 60 tablet 1   No current facility-administered medications for this visit.    Allergies  Allergen Reactions  . Amlodipine Other (See Comments)    Erectile dysfunction  . Other Rash    OTC soaps/shampoos  . Wellbutrin [Bupropion] Other (See Comments)    Worsening anxiety       Objective:  BP 128/85   Pulse 66   Wt 228 lb (103.4 kg)   BMI 29.27 kg/m  Gen:  alert, not ill-appearing, no distress, appropriate  for age HEENT: head normocephalic without obvious abnormality, conjunctiva and cornea clear, trachea midline Pulm: Normal work of breathing, normal phonation, clear to auscultation bilaterally, no wheezes, rales or rhonchi CV: Normal rate, regular rhythm, s1 and s2 distinct, no murmurs, clicks or rubs  Neuro: alert and oriented x 3, no tremor MSK: extremities atraumatic, normal gait and station Skin: intact, no rashes on exposed skin, no jaundice, no cyanosis Psych: well-groomed, cooperative, good eye contact, euthymic mood, affect mood-congruent, speech is articulate, and thought processes clear and goal-directed  BP Readings from Last 3 Encounters:  10/18/17 128/85  09/27/17 120/72  09/13/17 (!) 159/95     Lab Results  Component Value Date   CREATININE 1.01 09/13/2017   BUN 14 09/13/2017   NA 141 09/13/2017   K 3.7 09/13/2017   CL 103 09/13/2017   CO2 24 09/13/2017    No  results found for this or any previous visit (from the past 72 hour(s)). No results found.    Assessment and Plan: 27 y.o. male with   .Ricky DeerChristopher was seen today for hypertension.  Diagnoses and all orders for this visit:  Anxiety disorder, unspecified type -     escitalopram (LEXAPRO) 10 MG tablet; Take 0.5 tablets (5 mg total) by mouth at bedtime for 3 days, THEN 1 tablet (10 mg total) at bedtime for 7 days, THEN 2 tablets (20 mg total) at bedtime for 20 days.  Hypertension goal BP (blood pressure) < 130/80       Patient education and anticipatory guidance given Patient agrees with treatment plan Follow-up in 6 weeks for anxiety or sooner as needed if symptoms worsen or fail to improve  Levonne Hubertharley E. Shantee Hayne PA-C

## 2017-10-18 NOTE — Patient Instructions (Signed)
For your blood pressure: - Goal <130/80 - continue Lisinopril every morning - monitor and log blood pressures at home - check around the same time each day in a relaxed setting - Limit salt to <2000 mg/day - Follow DASH eating plan - limit alcohol to 2 standard drinks per day for men and 1 per day for women - avoid tobacco products - weight loss: 7% of current body weight - follow-up every 6 months for your blood pressure    Living With Anxiety After being diagnosed with an anxiety disorder, you may be relieved to know why you have felt or behaved a certain way. It is natural to also feel overwhelmed about the treatment ahead and what it will mean for your life. With care and support, you can manage this condition and recover from it. How to cope with anxiety Dealing with stress Stress is your body's reaction to life changes and events, both good and bad. Stress can last just a few hours or it can be ongoing. Stress can play a major role in anxiety, so it is important to learn both how to cope with stress and how to think about it differently. Talk with your health care provider or a counselor to learn more about stress reduction. He or she may suggest some stress reduction techniques, such as:  Music therapy. This can include creating or listening to music that you enjoy and that inspires you.  Mindfulness-based meditation. This involves being aware of your normal breaths, rather than trying to control your breathing. It can be done while sitting or walking.  Centering prayer. This is a kind of meditation that involves focusing on a word, phrase, or sacred image that is meaningful to you and that brings you peace.  Deep breathing. To do this, expand your stomach and inhale slowly through your nose. Hold your breath for 3-5 seconds. Then exhale slowly, allowing your stomach muscles to relax.  Self-talk. This is a skill where you identify thought patterns that lead to anxiety reactions and  correct those thoughts.  Muscle relaxation. This involves tensing muscles then relaxing them.  Choose a stress reduction technique that fits your lifestyle and personality. Stress reduction techniques take time and practice. Set aside 5-15 minutes a day to do them. Therapists can offer training in these techniques. The training may be covered by some insurance plans. Other things you can do to manage stress include:  Keeping a stress diary. This can help you learn what triggers your stress and ways to control your response.  Thinking about how you respond to certain situations. You may not be able to control everything, but you can control your reaction.  Making time for activities that help you relax, and not feeling guilty about spending your time in this way.  Therapy combined with coping and stress-reduction skills provides the best chance for successful treatment. Medicines Medicines can help ease symptoms. Medicines for anxiety include:  Anti-anxiety drugs.  Antidepressants.  Beta-blockers.  Medicines may be used as the main treatment for anxiety disorder, along with therapy, or if other treatments are not working. Medicines should be prescribed by a health care provider. Relationships Relationships can play a big part in helping you recover. Try to spend more time connecting with trusted friends and family members. Consider going to couples counseling, taking family education classes, or going to family therapy. Therapy can help you and others better understand the condition. How to recognize changes in your condition Everyone has a different response  to treatment for anxiety. Recovery from anxiety happens when symptoms decrease and stop interfering with your daily activities at home or work. This may mean that you will start to:  Have better concentration and focus.  Sleep better.  Be less irritable.  Have more energy.  Have improved memory.  It is important to recognize  when your condition is getting worse. Contact your health care provider if your symptoms interfere with home or work and you do not feel like your condition is improving. Where to find help and support: You can get help and support from these sources:  Self-help groups.  Online and [employer] organizations.  A trusted spiritual leader.  Couples counseling.  Family education classes.  Family therapy.  Follow these instructions at home:  Eat a healthy diet that includes plenty of vegetables, fruits, whole grains, low-fat dairy products, and lean protein. Do not eat a lot of foods that are high in solid fats, added sugars, or salt.  Exercise. Most adults should do the following: ? Exercise for at least 150 minutes each week. The exercise should increase your heart rate and make you sweat (moderate-intensity exercise). ? Strengthening exercises at least twice a week.  Cut down on caffeine, tobacco, alcohol, and other potentially harmful substances.  Get the right amount and quality of sleep. Most adults need 7-9 hours of sleep each night.  Make choices that simplify your life.  Take over-the-counter and prescription medicines only as told by your health care provider.  Avoid caffeine, alcohol, and certain over-the-counter cold medicines. These may make you feel worse. Ask your pharmacist which medicines to avoid.  Keep all follow-up visits as told by your health care provider. This is important. Questions to ask your health care provider  Would I benefit from therapy?  How often should I follow up with a health care provider?  How long do I need to take medicine?  Are there any long-term side effects of my medicine?  Are there any alternatives to taking medicine? Contact a health care provider if:  You have a hard time staying focused or finishing daily tasks.  You spend many hours a day feeling worried about everyday life.  You become exhausted by worry.  You start  to have headaches, feel tense, or have nausea.  You urinate more than normal.  You have diarrhea. Get help right away if:  You have a racing heart and shortness of breath.  You have thoughts of hurting yourself or others. If you ever feel like you may hurt yourself or others, or have thoughts about taking your own life, get help right away. You can go to your nearest emergency department or call:  Your local emergency services (911 in the [place]  A suicide crisis helpline, such as the National Suicide Prevention Lifeline at 267-079-11131-(240) 492-1245. This is open 24-hours a day.  Summary  Taking steps to deal with stress can help calm you.  Medicines cannot cure anxiety disorders, but they can help ease symptoms.  Family, friends, and partners can play a big part in helping you recover from an anxiety disorder. This information is not intended to replace advice given to you by your health care provider. Make sure you discuss any questions you have with your health care provider. Document Released: 02/11/2016 Document Revised: 02/11/2016 Document Reviewed: 02/11/2016 Elsevier Interactive Patient Education  [employer]2018 Elsevier Inc.

## 2017-10-27 ENCOUNTER — Encounter: Payer: Self-pay | Admitting: Physician Assistant

## 2017-11-29 ENCOUNTER — Encounter: Payer: Self-pay | Admitting: Physician Assistant

## 2017-11-29 ENCOUNTER — Ambulatory Visit (INDEPENDENT_AMBULATORY_CARE_PROVIDER_SITE_OTHER): Payer: Self-pay | Admitting: Physician Assistant

## 2017-11-29 VITALS — BP 107/74 | HR 58 | Wt 224.0 lb

## 2017-11-29 DIAGNOSIS — Z23 Encounter for immunization: Secondary | ICD-10-CM

## 2017-11-29 DIAGNOSIS — F419 Anxiety disorder, unspecified: Secondary | ICD-10-CM

## 2017-11-29 DIAGNOSIS — F439 Reaction to severe stress, unspecified: Secondary | ICD-10-CM | POA: Insufficient documentation

## 2017-11-29 MED ORDER — DOXEPIN HCL 50 MG PO CAPS: 50.0000 mg | ORAL_CAPSULE | Freq: Every day | ORAL | 3 refills | Status: DC

## 2017-11-29 MED ORDER — ESCITALOPRAM OXALATE 5 MG PO TABS: 15.0000 mg | ORAL_TABLET | Freq: Every day | ORAL | 0 refills | Status: DC

## 2017-11-29 NOTE — Patient Instructions (Signed)
Living With Post-Traumatic Stress Disorder If you have been diagnosed with post-traumatic stress disorder (PTSD), you may be relieved that you now know why you have felt or behaved a certain way. Still, you may feel overwhelmed about the treatment ahead. You may also wonder how to get the support you need and how to deal with the condition day-to-day. If you are living with PTSD, there are ways to help you recover from it and manage your symptoms. How to manage lifestyle changes Managing stress Stress is your body's reaction to life changes and events, both good and bad. Stress can make PTSD worse. Take the following steps to cope with stress:  Talk with your health care provider or a counselor if you would like to learn more about techniques to reduce your stress. He or she may suggest some stress reduction techniques such as: ? Muscle relaxation exercises. ? Regular exercise. ? Meditation, yoga, or other mind-body exercises. ? Breathing exercises. ? Listening to quiet music. ? Spending time outside.  Maintain a healthy lifestyle. Eat a healthy diet, exercise regularly, get plenty of sleep, and take time to relax.  Spend time with others. Talk with them about how you are feeling and what kind of support you need. Try to not isolate yourself, even though you may feel like doing that. Isolating yourself can delay your recovery.  Do activities and hobbies that you enjoy.  Pace yourself when doing stressful things. Take breaks, and reward yourself when you finish. Make sure that you do not overload your schedule.  Medicines Your health care provider may suggest certain medicines if he or she feels that they will help to improve your condition. Antidepressants or antipsychotic medicines may be used to treat PTSD. Avoid using alcohol and other substances that may prevent your medicines from working properly (may interact). It is also important to:  Talk with your pharmacist or health care  provider about all medicines that you take, their possible side effects, and which medicines are safe to take together.  Make it your goal to take part in all treatment decisions (shared decision-making). Ask about possible side effects of medicines that your health care provider recommends, and tell him or her how you feel about having those side effects. It is best if shared decision-making with your health care provider is part of your total treatment plan.  If your health care provider prescribes a medicine, you may not notice the full benefits of it for 4-8 weeks. Most people who are treated for PTSD need to take medicine for at least 6-12 months after they feel better. If you are taking medicines as part of your treatment, do not stop taking medicines before you ask your health care provider if it is safe to stop. You may need to have the medicine slowly decreased (tapered) over time to lower the risk of harmful side effects. Relationships Many people who have PTSD have difficulty trusting others. Make an effort to:  Take risks and develop trust with close friends and family members. Developing trust in others can help you feel safe and connect you with emotional support.  Be open and honest about your feelings.  Try to have fun and relax in safe spaces, such as with friends and family.  Think about going to couples counseling, family education classes, or family therapy. Your loved ones may not always know how to be supportive. Therapy can be helpful for everyone.  How to recognize changes in your condition Be aware of your   symptoms and how often you have them. The following symptoms mean that you need to seek help for your PTSD:  You feel suspicious and angry.  You have repeated flashbacks.  You avoid going out or being with others.  You have an increasing number of fights with close friends or family members, such as your spouse.  You have thoughts about hurting yourself or  others.  You cannot get relief from feelings of depression or anxiety.  Where to find support Talking to others  Explain that PTSD is a mental health problem. It is something that a person can develop after experiencing or seeing a life-threatening event. Tell them that PTSD makes you feel stress like you did during the event.  Talk to your loved ones about the symptoms you have. Also tell them what things or situations can cause symptoms to start (are triggers for you).  Assure your loved ones that there are treatments to help PTSD. Discuss possibly seeking family therapy or couples therapy.  If you are worried or fearful about seeking treatment, ask for support. Finances Not all insurance plans cover mental health care, so it is important to check with your insurance carrier. If paying for co-pays or counseling services is a problem, search for a local or county mental health care center. Public mental health care services may be offered there at a low cost or no cost when you are not able to see a private health care provider. If you are a veteran, contact a local veterans organization or veterans hospital for more information. If you are taking medicine for PTSD, you may be able to get the genericform, which may be less expensive than brand-name medicine. Some makers of prescription medicines also offer help to patients who cannot afford the medicines that they need. Community resources  Find a support group in your community. Often, groups are available for military veterans, trauma victims, and family members or caregivers.  Look into volunteer opportunities. Taking part in these can help you feel more connected to your community.  Contact a local organization to find out if you are eligible for a service dog.  Keep daily contact with at least one trusted friend or family member. Follow these instructions at home: Lifestyle  Exercise regularly. Try to do 30 or more minutes of  physical activity on most days of the week.  Try to get 7-9 hours of sleep each night. To help with sleep: ? Keep your bedroom cool and dark. ? Do not eat a heavy meal during the hour before you go to bed. ? Do not drink alcohol or caffeinated drinks before bed. ? Avoid screen time before bedtime. This means avoiding use of your TV, computer, tablet, and cell phone.  Avoid using alcohol or drugs.  Practice self-soothing skills and use them daily.  Try to have fun and seek humor in your life. General instructions  If your PTSD is affecting your marriage or family, seek help from a family therapist.  Take over-the-counter and prescription medicines only as told by your health care provider.  Make sure to let all of your health care providers know that you have PTSD. This is especially important if you are having surgery or need to be admitted to the hospital.  Keep all follow-up visits as told by your health care providers. This is important. Where to find more information: Go to this website to find more information about PTSD, treatment for PTSD, and how to get support:  National   Center for PTSD: www.ptsd.va.gov  Contact a health care provider if:  Your symptoms get worse or they do not get better. Get help right away if:  You have thoughts about hurting yourself or others. If you ever feel like you may hurt yourself or others, or have thoughts about taking your own life, get help right away. You can go to your nearest emergency department or call:  Your local emergency services (911 in the [place]  A suicide crisis helpline, such as the National Suicide Prevention Lifeline at 1-800-273-8255. This is open 24-hours a day.  Summary  If you are living with PTSD, there are ways to help you recover from it and manage your symptoms.  Find supportive environments and people who understand PTSD. Spend time in those places, and maintain contact with those people.  Work with your  health care team to create a management plan that includes counseling, stress management techniques, and healthy lifestyle habits. This information is not intended to replace advice given to you by your health care provider. Make sure you discuss any questions you have with your health care provider. Document Released: 06/18/2016 Document Revised: 06/18/2016 Document Reviewed: 06/18/2016 Elsevier Interactive Patient Education  2018 Elsevier Inc.  

## 2017-11-29 NOTE — Progress Notes (Signed)
HPI:                                                                Ricky Dalton is a 27 y.o. male who presents to [facility] today for anxiety follow-up  Anxiety: started on Lexapro self-titration approx 5 weeks ago. Currently taking 20 mg daily. Feels medication has been helpful, he has no symptoms of anxiety, but he also feels like he has no feelings. Continues to endorse chronic sleep difficulties. Takes several hours to fall asleep and has 3-4 nighttime awakenings. He is currently filing at PTSD claim with the [place], but is not receiving any treatment. Denies symptoms of mania/hypomania. Denies suicidal thinking. Denies auditory/visual hallucinations.    Depression screen [facility] 2/9 11/29/2017 09/27/2017  Decreased Interest 1 1  Down, Depressed, Hopeless 0 1  PHQ - 2 Score 1 2  Altered sleeping 2 3  Tired, decreased energy 2 0  Change in appetite 0 0  Feeling bad or failure about yourself  0 0  Trouble concentrating 0 1  Moving slowly or fidgety/restless 0 0  Suicidal thoughts 0 0  PHQ-9 Score 5 6  Difficult doing work/chores Not difficult at all Somewhat difficult    GAD 7 : Generalized Anxiety Score 11/29/2017 09/27/2017  Nervous, Anxious, on Edge 0 3  Control/stop worrying 0 3  Worry too much - different things 0 3  Trouble relaxing 0 2  Restless 0 2  Easily annoyed or irritable 0 3  Afraid - awful might happen 0 3  Total GAD 7 Score 0 19  Anxiety Difficulty Not difficult at all Very difficult      Past Medical History:  Diagnosis Date  . Arthritis   . Gout   . Hyperlipidemia   . Hypertension    Past Surgical History:  Procedure Laterality Date  . APPENDECTOMY    . NASAL SEPTUM SURGERY     Social History   Tobacco Use  . Smoking status: Never Smoker  . Smokeless tobacco: Former Neurosurgeon    Types: Chew  Substance Use Topics  . Alcohol use: Yes    Alcohol/week: 1.0 standard drinks    Types: 1 Standard  drinks or equivalent per week   family history includes Diabetes in his father; Heart disease in his father and sister; Hypertension in his father and paternal uncle; Stroke in his father and paternal uncle.    ROS: negative except as noted in the HPI  Medications: Current Outpatient Medications  Medication Sig Dispense Refill  . lisinopril (PRINIVIL,ZESTRIL) 20 MG tablet Take 1 tablet (20 mg total) by mouth daily. 30 tablet 5   No current facility-administered medications for this visit.    Allergies  Allergen Reactions  . Amlodipine Other (See Comments)    Erectile dysfunction  . Other Rash    OTC soaps/shampoos  . Wellbutrin [Bupropion] Other (See Comments)    Worsening anxiety       Objective:  BP 107/74   Pulse (!) 58   Wt 224 lb (101.6 kg)   BMI 28.76 kg/m  Gen:  alert, not ill-appearing, no distress, appropriate for age HEENT: head normocephalic without obvious abnormality, conjunctiva and cornea clear, trachea midline Pulm: Normal work of breathing, normal phonation Neuro:  alert and oriented x 3, no tremor MSK: extremities atraumatic, normal gait and station Skin: intact, no rashes on exposed skin, no jaundice, no cyanosis Psych: appearance casual, cooperative, downward gaze, mood "I feel nothing," affect flat and mood-congruent, speech is articulate, and thought processes clear and goal-directed    No results found for this or any previous visit (from the past 72 hour(s)). No results found.    Assessment and Plan: 27 y.o. male with   .Diagnoses and all orders for this visit:  Trauma and stressor-related disorder -     escitalopram (LEXAPRO) 5 MG tablet; Take 3 tablets (15 mg total) by mouth at bedtime. -     doxepin (SINEQUAN) 50 MG capsule; Take 1 capsule (50 mg total) by mouth at bedtime.  Anxiety disorder, unspecified type -     escitalopram (LEXAPRO) 5 MG tablet; Take 3 tablets (15 mg total) by mouth at bedtime. -     doxepin (SINEQUAN) 50 MG  capsule; Take 1 capsule (50 mg total) by mouth at bedtime.  Need for immunization against influenza -     Flu Vaccine QUAD 36+ mos IM   GAD7=0, compared to 19, 5 weeks ago Good response to Lexapro, however, he is having some SSRI-induced apathy/indifference, so we will reduce the dose Starting Doxepin QHS for insomnia We discussed referral to PTSD specialist, he would like to defer and await [facility]    Patient education and anticipatory guidance given Patient agrees with treatment plan Follow-up in 8 weeks or sooner as needed if symptoms worsen or fail to improve  Levonne Hubert PA-C

## 2017-12-26 ENCOUNTER — Other Ambulatory Visit: Payer: Self-pay | Admitting: Physician Assistant

## 2018-01-31 ENCOUNTER — Ambulatory Visit: Payer: Self-pay | Admitting: Physician Assistant

## 2018-03-07 ENCOUNTER — Encounter: Payer: Self-pay | Admitting: Physician Assistant

## 2018-03-07 DIAGNOSIS — F419 Anxiety disorder, unspecified: Secondary | ICD-10-CM

## 2018-03-08 MED ORDER — ESCITALOPRAM OXALATE 20 MG PO TABS: 20.0000 mg | ORAL_TABLET | Freq: Every day | ORAL | 0 refills | Status: DC

## 2018-04-26 ENCOUNTER — Encounter: Payer: Self-pay | Admitting: Physician Assistant

## 2018-04-26 ENCOUNTER — Ambulatory Visit (INDEPENDENT_AMBULATORY_CARE_PROVIDER_SITE_OTHER): Payer: Self-pay | Admitting: Physician Assistant

## 2018-04-26 VITALS — BP 117/75 | HR 71 | Wt 220.0 lb

## 2018-04-26 DIAGNOSIS — F439 Reaction to severe stress, unspecified: Secondary | ICD-10-CM

## 2018-04-26 DIAGNOSIS — R809 Proteinuria, unspecified: Secondary | ICD-10-CM

## 2018-04-26 DIAGNOSIS — M25561 Pain in right knee: Secondary | ICD-10-CM

## 2018-04-26 DIAGNOSIS — Z8261 Family history of arthritis: Secondary | ICD-10-CM | POA: Insufficient documentation

## 2018-04-26 DIAGNOSIS — M25562 Pain in left knee: Secondary | ICD-10-CM

## 2018-04-26 DIAGNOSIS — Z8669 Personal history of other diseases of the nervous system and sense organs: Secondary | ICD-10-CM | POA: Insufficient documentation

## 2018-04-26 DIAGNOSIS — G8929 Other chronic pain: Secondary | ICD-10-CM | POA: Insufficient documentation

## 2018-04-26 DIAGNOSIS — R5383 Other fatigue: Secondary | ICD-10-CM | POA: Insufficient documentation

## 2018-04-26 DIAGNOSIS — E782 Mixed hyperlipidemia: Secondary | ICD-10-CM

## 2018-04-26 NOTE — Patient Instructions (Signed)
Fatigue If you have fatigue, you feel tired all the time and have a lack of energy or a lack of motivation. Fatigue may make it difficult to start or complete tasks because of exhaustion. In general, occasional or mild fatigue is often a normal response to activity or life. However, long-lasting (chronic) or extreme fatigue may be a symptom of a medical condition. Follow these instructions at home: General instructions  Watch your fatigue for any changes.  Go to bed and get up at the same time every day.  Avoid fatigue by pacing yourself during the day and getting enough sleep at night.  Maintain a healthy weight. Medicines  Take over-the-counter and prescription medicines only as told by your health care provider.  Take a multivitamin, if told by your health care provider.  Do not use herbal or dietary supplements unless they are approved by your health care provider. Activity   Exercise regularly, as told by your health care provider.  Use or practice techniques to help you relax, such as yoga, tai chi, meditation, or massage therapy. Eating and drinking   Avoid heavy meals in the evening.  Eat a well-balanced diet, which includes lean proteins, whole grains, plenty of fruits and vegetables, and low-fat dairy products.  Avoid consuming too much caffeine.  Avoid the use of alcohol.  Drink enough fluid to keep your urine pale yellow. Lifestyle  Change situations that cause you stress. Try to keep your work and personal schedule in balance.  Do not use any products that contain nicotine or tobacco, such as cigarettes and e-cigarettes. If you need help quitting, ask your health care provider.  Do not use drugs. Contact a health care provider if:  Your fatigue does not get better.  You have a fever.  You suddenly lose or gain weight.  You have headaches.  You have trouble falling asleep or sleeping through the night.  You feel angry, guilty, anxious, or  sad.  You are unable to have a bowel movement (constipation).  Your skin is dry.  You have swelling in your legs or another part of your body. Get help right away if:  You feel confused.  Your vision is blurry.  You feel faint or you pass out.  You have a severe headache.  You have severe pain in your abdomen, your back, or the area between your waist and hips (pelvis).  You have chest pain, shortness of breath, or an irregular or fast heartbeat.  You are unable to urinate, or you urinate less than normal.  You have abnormal bleeding, such as bleeding from the rectum, vagina, nose, lungs, or nipples.  You vomit blood.  You have thoughts about hurting yourself or others. If you ever feel like you may hurt yourself or others, or have thoughts about taking your own life, get help right away. You can go to your nearest emergency department or call:  Your local emergency services (911 in the [place]  A suicide crisis helpline, such as the [place] at 9164775271. This is open 24 hours a day. Summary  If you have fatigue, you feel tired all the time and have a lack of energy or a lack of motivation.  Fatigue may make it difficult to start or complete tasks because of exhaustion.  Long-lasting (chronic) or extreme fatigue may be a symptom of a medical condition.  Exercise regularly, as told by your health care provider.  Change situations that cause you stress. Try to keep your  work and personal schedule in balance. This information is not intended to replace advice given to you by your health care provider. Make sure you discuss any questions you have with your health care provider. Document Released: 12/14/2006 Document Revised: 11/11/2016 Document Reviewed: 11/11/2016 Elsevier Interactive Patient Education  2019 [employer] With Sleep Apnea Sleep apnea is a condition in which breathing pauses or becomes shallow during sleep.  Sleep apnea is most commonly caused by a collapsed or blocked airway. People with sleep apnea snore loudly and have times when they gasp and stop breathing for 10 seconds or more during sleep. This happens over and over during the night. This disrupts your sleep and keeps your body from getting the rest that it needs, which can cause tiredness and lack of energy (fatigue) during the day. The breaks in breathing also interrupt the deep sleep that you need to feel rested. Even if you do not completely wake up from the gaps in breathing, your sleep may not be restful. You may also have a headache in the morning and low energy during the day, and you may feel anxious or depressed. How can sleep apnea affect me? Sleep apnea increases your chances of extreme tiredness during the day (daytime fatigue). It can also increase your risk for health conditions, such as:  Heart attack.  Stroke.  Diabetes.  Heart failure.  Irregular heartbeat.  High blood pressure. If you have daytime fatigue as a result of sleep apnea, you may be more likely to:  Perform poorly at school or work.  Fall asleep while driving.  Have difficulty with attention.  Develop depression or anxiety.  Become severely overweight (obese).  Have sexual dysfunction. What actions can I take to manage sleep apnea? Sleep apnea treatment   If you were given a device to open your airway while you sleep, use it only as told by your health care provider. You may be given: ? An oral appliance. This is a custom-made mouthpiece that shifts your lower jaw forward. ? A continuous positive airway pressure (CPAP) device. This device blows air through a mask when you breathe out (exhale). ? A nasal expiratory positive airway pressure (EPAP) device. This device has valves that you put into each nostril. ? A bi-level positive airway pressure (BPAP) device. This device blows air through a mask when you breathe in (inhale) and breathe out  (exhale).  You may need surgery if other treatments do not work for you. Sleep habits  Go to sleep and wake up at the same time every day. This helps set your internal clock (circadian rhythm) for sleeping. ? If you stay up later than usual, such as on weekends, try to get up in the morning within 2 hours of your normal wake time.  Try to get at least 7-9 hours of sleep each night.  Stop computer, tablet, and mobile phone use a few hours before bedtime.  Do not take long naps during the day. If you nap, limit it to 30 minutes.  Have a relaxing bedtime routine. Reading or listening to music may relax you and help you sleep.  Use your bedroom only for sleep. ? Keep your television and computer out of your bedroom. ? Keep your bedroom cool, dark, and quiet. ? Use a supportive mattress and pillows.  Follow your health care provider's instructions for other changes to sleep habits. Nutrition  Do not eat heavy meals in the evening.  Do not have caffeine in the  later part of the day. The effects of caffeine can last for more than 5 hours.  Follow your health care provider's or dietitian's instructions for any diet changes. Lifestyle      Do not drink alcohol before bedtime. Alcohol can cause you to fall asleep at first, but then it can cause you to wake up in the middle of the night and have trouble getting back to sleep.  Do not use any products that contain nicotine or tobacco, such as cigarettes and e-cigarettes. If you need help quitting, ask your health care provider. Medicines  Take over-the-counter and prescription medicines only as told by your health care provider.  Do not use over-the-counter sleep medicine. You can become dependent on this medicine, and it can make sleep apnea worse.  Do not use medicines, such as sedatives and narcotics, unless told by your health care provider. Activity  Exercise on most days, but avoid exercising in the evening. Exercising near  bedtime can interfere with sleeping.  If possible, spend time outside every day. Natural light helps regulate your circadian rhythm. General information  Lose weight if you need to, and maintain a healthy weight.  Keep all follow-up visits as told by your health care provider. This is important.  If you are having surgery, make sure to tell your health care provider that you have sleep apnea. You may need to bring your device with you. Where to find more information Learn more about sleep apnea and daytime fatigue from:  American Sleep Association: sleepassociation.[place]: sleepfoundation.org  National Heart, Lung, and Blood Institute: https://www.[place].biz/ Summary  Sleep apnea can cause daytime fatigue and other serious health conditions.  Both sleep apnea and daytime fatigue can be bad for your health and well-being.  You may need to wear a device while sleeping to help keep your airway open.  If you are having surgery, make sure to tell your health care provider that you have sleep apnea. You may need to bring your device with you.  Making changes to sleep habits, diet, lifestyle, and activity can help you manage sleep apnea. This information is not intended to replace advice given to you by your health care provider. Make sure you discuss any questions you have with your health care provider. Document Released: 05/13/2017 Document Revised: 10/19/2017 Document Reviewed: 05/13/2017 Elsevier Interactive Patient Education  [employer].

## 2018-04-26 NOTE — Progress Notes (Signed)
HPI:                                                                Ricky Dalton is a 28 y.o. male who presents to [facility]: Primary Care [facility] Medicine today for fatigue  Pleasant 28 yo M with PMH of OSA, RA, depression presents with worsening fatigue for the last 3-4 months. Works 12 hour shifts x 5-6 days/week for the last 12 years, states he has always been very efficient and had no difficulty with his work schedule. Has noticed a change in his work performance and overall decreased energy. Reports he has fallen asleep driving on one occasion. He falls asleep every day after eating lunch. Endorses unintended weight loss, 30 pounds in 4-5 months (240 in November, 212 today by home scale)  Sleep schedule -  bed time 9:30-10pm-6:30-7:30am Sleep is non-restorative Reports he had surgery for OSA in 2012 to repair a deviated septum, remove turbinates Snoring began again about 6 months post-op He has a CPAP, but has not used it in years due to intolerance and inability to sleep with the mask.   Reports history of RA affecting both knees. He states he does steroid injections approx once per year through the [place], last injection was July 2019. Reports strong family hx of RA in [place], maternal uncle, multiple cousins    Depression screen [facility] 2/9 04/26/2018 11/29/2017 09/27/2017  Decreased Interest 0 1 1  Down, Depressed, Hopeless 0 0 1  PHQ - 2 Score 0 1 2  Altered sleeping - 2 3  Tired, decreased energy - 2 0  Change in appetite - 0 0  Feeling bad or failure about yourself  - 0 0  Trouble concentrating - 0 1  Moving slowly or fidgety/restless - 0 0  Suicidal thoughts - 0 0  PHQ-9 Score - 5 6  Difficult doing work/chores - Not difficult at all Somewhat difficult    GAD 7 : Generalized Anxiety Score 11/29/2017 09/27/2017  Nervous, Anxious, on Edge 0 3  Control/stop worrying 0 3  Worry too much - different things 0 3  Trouble relaxing 0 2  Restless 0 2   Easily annoyed or irritable 0 3  Afraid - awful might happen 0 3  Total GAD 7 Score 0 19  Anxiety Difficulty Not difficult at all Very difficult      Past Medical History:  Diagnosis Date  . Arthritis   . Gout   . Hyperlipidemia   . Hypertension    Past Surgical History:  Procedure Laterality Date  . APPENDECTOMY    . NASAL SEPTUM SURGERY     Social History   Tobacco Use  . Smoking status: Never Smoker  . Smokeless tobacco: Former Neurosurgeon    Types: Chew  Substance Use Topics  . Alcohol use: Yes    Alcohol/week: 1.0 standard drinks    Types: 1 Standard drinks or equivalent per week   family history includes Diabetes in his father; Heart disease in his father and sister; Hypertension in his father and paternal uncle; Stroke in his father and paternal uncle.   Review of Systems  Constitutional: Positive for activity change, fatigue and unexpected weight change. Negative for chills, diaphoresis and fever.  Respiratory: Positive for  apnea (OSA).   Genitourinary:       + erectile dysfunction + decreased libido  Musculoskeletal: Positive for arthralgias (bilateral knees) and back pain.  Skin: Negative for rash.       + hair loss  Psychiatric/Behavioral: Negative for decreased concentration and dysphoric mood. The patient is not nervous/anxious.      Medications: Current Outpatient Medications  Medication Sig Dispense Refill  . doxepin (SINEQUAN) 50 MG capsule Take 1 capsule (50 mg total) by mouth at bedtime. 30 capsule 3  . escitalopram (LEXAPRO) 20 MG tablet Take 1 tablet (20 mg total) by mouth daily. (Patient not taking: Reported on 04/26/2018) 90 tablet 0   No current facility-administered medications for this visit.    Allergies  Allergen Reactions  . Amlodipine Other (See Comments)    Erectile dysfunction  . Other Rash    OTC soaps/shampoos  . Wellbutrin [Bupropion] Other (See Comments)    Worsening anxiety       Objective:  BP 117/75   Pulse 71   Wt  220 lb (99.8 kg)   BMI 28.25 kg/m  Gen:  alert, not ill-appearing, no distress, appropriate for age HEENT: head normocephalic without obvious abnormality, conjunctiva and cornea clear, trachea midline Pulm: Normal work of breathing, normal phonation Neuro: alert and oriented x 3, no tremor MSK: extremities atraumatic, normal gait and station Skin: intact, no rashes on exposed skin, no jaundice, no cyanosis Psych: well-groomed, cooperative, good eye contact, euthymic mood, affect mood-congruent, speech is articulate, and thought processes clear and goal-directed  Wt Readings from Last 3 Encounters:  04/26/18 220 lb (99.8 kg)  11/29/17 224 lb (101.6 kg)  10/18/17 228 lb (103.4 kg)     No results found for this or any previous visit (from the past 72 hour(s)). No results found.    Assessment and Plan: 28 y.o. male with   .Diagnoses and all orders for this visit:  Fatigue, unspecified type -     C-reactive protein -     Ferritin -     Sedimentation rate -     Vitamin B12 -     Vit D  25 hydroxy (rtn osteoporosis monitoring) -     CBC with Differential/Platelet -     COMPLETE METABOLIC PANEL WITH GFR -     TSH + free T4 -     Testosterone -     Rheumatoid Factor -     Cyclic citrul peptide antibody, IgG  History of sleep apnea  Trauma and stressor-related disorder  Mixed hyperlipidemia -     Lipid Panel w/reflex Direct LDL  Microalbuminuria -     Urine Microalbumin w/creat. ratio  Family history of rheumatoid arthritis -     Rheumatoid Factor -     Cyclic citrul peptide antibody, IgG  Bilateral chronic knee pain -     Rheumatoid Factor -     Cyclic citrul peptide antibody, IgG   No significant weight loss by our scale. Approx 4 pounds over 5 month period In the setting of untreated OSA, this is the most likely cause of his fatigue. We discussed this and he states he will not use a CPAP. Risks, benefits explained. If he truly has RA, this will also be  contributory Obtaining rheumatologic labs and fasting testosterone for +ADAM Questionnaire  Patient education and anticipatory guidance given Patient agrees with treatment plan Follow-up as needed if symptoms worsen or fail to improve  I spent 25 minutes with this patient, greater than  50% was face-to-face time counseling regarding the above diagnoses  Levonne Hubert PA-C

## 2018-04-27 ENCOUNTER — Encounter: Payer: Self-pay | Admitting: Physician Assistant

## 2018-04-28 NOTE — Telephone Encounter (Signed)
Spoke with Dory from [employer] about this situation. She is going to do some further investigation and reach back out.

## 2018-05-19 ENCOUNTER — Telehealth: Payer: Self-pay | Admitting: Physician Assistant

## 2018-05-19 NOTE — Telephone Encounter (Signed)
PT came into office requesting a note to return back to work. He is getting his bloodwork done this morning as well.   Please Advise.

## 2018-05-20 ENCOUNTER — Encounter: Payer: Self-pay | Admitting: Physician Assistant

## 2018-05-21 LAB — CYCLIC CITRUL PEPTIDE ANTIBODY, IGG

## 2018-05-21 LAB — COMPLETE METABOLIC PANEL WITH GFR
AG Ratio: 2.2 (calc) (ref 1.0–2.5)
ALBUMIN MSPROF: 4.7 g/dL (ref 3.6–5.1)
ALT: 21 U/L (ref 9–46)
AST: 17 U/L (ref 10–40)
Alkaline phosphatase (APISO): 53 U/L (ref 36–130)
BUN: 12 mg/dL (ref 7–25)
CO2: 27 mmol/L (ref 20–32)
CREATININE: 1.05 mg/dL (ref 0.60–1.35)
Calcium: 9.3 mg/dL (ref 8.6–10.3)
Chloride: 106 mmol/L (ref 98–110)
GFR, Est African American: 111 mL/min/{1.73_m2} (ref >=60)
GFR, Est Non African American: 95 mL/min/{1.73_m2} (ref >=60)
GLOBULIN: 2.1 g/dL (ref 1.9–3.7)
Glucose, Bld: 92 mg/dL (ref 65–99)
Potassium: 3.8 mmol/L (ref 3.5–5.3)
SODIUM: 141 mmol/L (ref 135–146)
Total Bilirubin: 0.8 mg/dL (ref 0.2–1.2)
Total Protein: 6.8 g/dL (ref 6.1–8.1)

## 2018-05-21 LAB — CBC WITH DIFFERENTIAL/PLATELET
Absolute Monocytes: 590 cells/uL (ref 200–950)
BASOS ABS: 67 {cells}/uL (ref 0–200)
Basophils Relative: 1 %
EOS PCT: 4.5 %
Eosinophils Absolute: 302 cells/uL (ref 15–500)
HEMATOCRIT: 43.1 % (ref 38.5–50.0)
HEMOGLOBIN: 15.2 g/dL (ref 13.2–17.1)
Lymphs Abs: 2848 cells/uL (ref 850–3900)
MCH: 30.2 pg (ref 27.0–33.0)
MCHC: 35.3 g/dL (ref 32.0–36.0)
MCV: 85.5 fL (ref 80.0–100.0)
MPV: 11.1 fL (ref 7.5–12.5)
Monocytes Relative: 8.8 %
NEUTROS ABS: 2894 {cells}/uL (ref 1500–7800)
Neutrophils Relative %: 43.2 %
Platelets: 178 10*3/uL (ref 140–400)
RBC: 5.04 10*6/uL (ref 4.20–5.80)
RDW: 13.1 % (ref 11.0–15.0)
Total Lymphocyte: 42.5 %
WBC: 6.7 10*3/uL (ref 3.8–10.8)

## 2018-05-21 LAB — MICROALBUMIN / CREATININE URINE RATIO
Creatinine, Urine: 188 mg/dL (ref 20–320)
MICROALB UR: 6 mg/dL
Microalb Creat Ratio: 32 mcg/mg creat — ABNORMAL HIGH (ref <30)

## 2018-05-21 LAB — C. TRACHOMATIS/N. GONORRHOEAE RNA
C. trachomatis RNA, TMA: NOT DETECTED
N. gonorrhoeae RNA, TMA: NOT DETECTED

## 2018-05-21 LAB — DIRECT LDL: Direct LDL: 60 mg/dL (ref <100)

## 2018-05-21 LAB — LIPID PANEL W/REFLEX DIRECT LDL
Cholesterol: 147 mg/dL (ref <200)
HDL: 26 mg/dL — ABNORMAL LOW (ref >=40)
Non-HDL Cholesterol (Calc): 121 mg/dL (calc) (ref <130)
Total CHOL/HDL Ratio: 5.7 (calc) — ABNORMAL HIGH (ref <5.0)
Triglycerides: 460 mg/dL — ABNORMAL HIGH (ref <150)

## 2018-05-21 LAB — TSH+FREE T4: TSH W/REFLEX TO FT4: 0.57 mIU/L (ref 0.40–4.50)

## 2018-05-21 LAB — RHEUMATOID FACTOR: Rhuematoid fact SerPl-aCnc: <14 IU/mL (ref <14)

## 2018-05-21 LAB — C-REACTIVE PROTEIN: CRP: 0.4 mg/L (ref <8.0)

## 2018-05-21 LAB — FERRITIN: Ferritin: 293 ng/mL (ref 38–380)

## 2018-05-21 LAB — SEDIMENTATION RATE: Sed Rate: 2 mm/h (ref 0–15)

## 2018-05-21 LAB — VITAMIN B12: Vitamin B-12: 371 pg/mL (ref 200–1100)

## 2018-05-21 LAB — VITAMIN D 25 HYDROXY (VIT D DEFICIENCY, FRACTURES): Vit D, 25-Hydroxy: 19 ng/mL — ABNORMAL LOW (ref 30–100)

## 2018-05-21 LAB — TESTOSTERONE: Testosterone: 327 ng/dL (ref 250–827)

## 2018-05-23 ENCOUNTER — Encounter: Payer: Self-pay | Admitting: Physician Assistant

## 2018-05-23 DIAGNOSIS — E781 Pure hyperglyceridemia: Secondary | ICD-10-CM

## 2018-05-23 DIAGNOSIS — E559 Vitamin D deficiency, unspecified: Secondary | ICD-10-CM

## 2018-05-23 HISTORY — DX: Pure hyperglyceridemia: E78.1

## 2018-05-23 HISTORY — DX: Vitamin D deficiency, unspecified: E55.9

## 2018-06-19 ENCOUNTER — Other Ambulatory Visit: Payer: Self-pay | Admitting: Physician Assistant

## 2018-06-19 DIAGNOSIS — F419 Anxiety disorder, unspecified: Secondary | ICD-10-CM

## 2018-07-16 ENCOUNTER — Encounter: Payer: Self-pay | Admitting: Physician Assistant

## 2018-08-10 ENCOUNTER — Encounter: Payer: Self-pay | Admitting: Family Medicine

## 2018-08-10 ENCOUNTER — Ambulatory Visit (INDEPENDENT_AMBULATORY_CARE_PROVIDER_SITE_OTHER): Payer: BLUE CROSS/BLUE SHIELD | Admitting: Family Medicine

## 2018-08-10 VITALS — BP 120/76 | HR 49 | Temp 98.3°F | Wt 202.0 lb

## 2018-08-10 DIAGNOSIS — M25562 Pain in left knee: Secondary | ICD-10-CM | POA: Diagnosis not present

## 2018-08-10 DIAGNOSIS — F419 Anxiety disorder, unspecified: Secondary | ICD-10-CM

## 2018-08-10 MED ORDER — DICLOFENAC SODIUM 1 % TD GEL: 4.0000 g | Freq: Four times a day (QID) | TRANSDERMAL | 11 refills | Status: DC

## 2018-08-10 MED ORDER — ESCITALOPRAM OXALATE 20 MG PO TABS: 20.0000 mg | ORAL_TABLET | Freq: Every day | ORAL | 1 refills | Status: AC

## 2018-08-10 NOTE — Patient Instructions (Addendum)
Thank you for coming in today. Get lab today for uric acid for gout evaluation. I will get results to you ASAP.  Continue to work on quad strength and exercise.  Use diclofenac gel up to 4x daily for pain as needed.  If the insurance gets fussy with the diclofenac gel you can use GoodRx.    Acute Knee Pain, Adult Many things can cause knee pain. Sometimes, knee pain is sudden (acute) and may be caused by damage, swelling, or irritation of the muscles and tissues that support your knee. The pain often goes away on its own with time and rest. If the pain does not go away, tests may be done to find out what is causing the pain. Follow these instructions at home: Pay attention to any changes in your symptoms. Take these actions to relieve your pain. If you have a knee sleeve or brace:   Wear the sleeve or brace as told by your doctor. Remove it only as told by your doctor.  Loosen the sleeve or brace if your toes: ? Tingle. ? Become numb. ? Turn cold and blue.  Keep the sleeve or brace clean.  If the sleeve or brace is not waterproof: ? Do not let it get wet. ? Cover it with a watertight covering when you take a bath or shower. Activity  Rest your knee.  Do not do things that cause pain.  Avoid activities where both feet leave the ground at the same time (high-impact activities). Examples are running, jumping rope, and doing jumping jacks.  Work with a physical therapist to make a safe exercise program, as told by your doctor. Managing pain, stiffness, and swelling   If told, put ice on the knee: ? Put ice in a plastic bag. ? Place a towel between your skin and the bag. ? Leave the ice on for 20 minutes, 2-3 times a day.  If told, put pressure (compression) on your injured knee to control swelling, give support, and help with discomfort. Compression may be done with an elastic bandage. General instructions  Take all medicines only as told by your doctor.  Raise (elevate)  your knee while you are sitting or lying down. Make sure your knee is higher than your heart.  Sleep with a pillow under your knee.  Do not use any products that contain nicotine or tobacco. These include cigarettes, e-cigarettes, and chewing tobacco. These products may slow down healing. If you need help quitting, ask your doctor.  If you are overweight, work with your doctor and a food expert (dietitian) to set goals to lose weight. Being overweight can make your knee hurt more.  Keep all follow-up visits as told by your doctor. This is important. Contact a doctor if:  The knee pain does not stop.  The knee pain changes or gets worse.  You have a fever along with knee pain.  Your knee feels warm when you touch it.  Your knee gives out or locks up. Get help right away if:  Your knee swells, and the swelling gets worse.  You cannot move your knee.  You have very bad knee pain. Summary  Many things can cause knee pain. The pain often goes away on its own with time and rest.  Your doctor may do tests to find out the cause of the pain.  Pay attention to any changes in your symptoms. Relieve your pain with rest, medicines, light activity, and use of ice.  Get help right away  if you cannot move your knee or your knee pain is very bad. This information is not intended to replace advice given to you by your health care provider. Make sure you discuss any questions you have with your health care provider. Document Released: 05/15/2008 Document Revised: 07/29/2017 Document Reviewed: 07/29/2017 Elsevier Interactive Patient Education  2019 [employer]

## 2018-08-10 NOTE — Progress Notes (Signed)
Ricky HarpChristopher T Dalton is a 28 y.o. male who presents to [facility] today for left knee pain.  Patient developed left knee pain started 4 days ago without injury.  Pain is located at the medial joint line worse with activity and knee flexion.  His pain is improved when he extends his knees.  He denies locking or catching or giving way but does note clicking.  He has had repeated episodes of knee pain in the past.  He has been worked up at the [facility] and thought perhaps to have rheumatoid arthritis or gout but never had significant labs.  PCP had work-up for rheumatoid arthritis in March which was normal.  Uric acid was not obtained.  He notes a little bit of swelling.  He denies fevers chills nausea vomiting or diarrhea.  He is tried some ibuprofen which helps a bit.  In the past he is done well with steroid injections and would like a cortisone shot if possible today.    ROS:  As above  Exam:  BP 120/76   Pulse (!) 49   Temp 98.3 F (36.8 C) (Oral)   Wt 202 lb (91.6 kg)   BMI 25.94 kg/m  Wt Readings from Last 5 Encounters:  08/10/18 202 lb (91.6 kg)  04/26/18 220 lb (99.8 kg)  11/29/17 224 lb (101.6 kg)  10/18/17 228 lb (103.4 kg)  09/27/17 228 lb (103.4 kg)   General: Well Developed, well nourished, and in no acute distress.  Neuro/Psych: Alert and oriented x3, extra-ocular muscles intact, able to move all 4 extremities, sensation grossly intact. Skin: Warm and dry, no rashes noted.  Respiratory: Not using accessory muscles, speaking in full sentences, trachea midline.  Cardiovascular: Pulses palpable, no extremity edema. Abdomen: Does not appear distended. MSK:  Left knee: Mild effusion no erythema or deformity. Range of motion 0-120 degrees with crepitation present. Tender palpation medial joint line.  Nontender elsewhere. Stable ligamentous exam. Intact strength.  Procedure: Real-time Ultrasound Guided Injection of left knee  Device: GE Logiq E   Images permanently stored and available for review in the ultrasound unit. Verbal informed consent obtained.  Discussed risks and benefits of procedure. Warned about infection bleeding damage to structures skin hypopigmentation and fat atrophy among others. Patient expresses understanding and agreement Time-out conducted.   Noted no overlying erythema, induration, or other signs of local infection.   Skin prepped in a sterile fashion.   Local anesthesia: Topical Ethyl chloride.   With sterile technique and under real time ultrasound guidance:  Milligrams of Depo-Medrol and 4 mL of Marcaine injected easily.   Completed without difficulty   Pain immediately resolved suggesting accurate placement of the medication.   Advised to call if fevers/chills, erythema, induration, drainage, or persistent bleeding.   Images permanently stored and available for review in the ultrasound unit.  Impression: Technically successful ultrasound guided injection.         Assessment and Plan: 28 y.o. male with left knee pain.  Plan to treat with steroid injection and topical diclofenac gel as well as quad strengthening and weight loss.  Work-up for possible gout with uric acid level today.  Recheck if needed.  Lexapro refilled.   PDMP not reviewed this encounter. Orders Placed This Encounter  Procedures  . Uric acid   Meds ordered this encounter  Medications  . escitalopram (LEXAPRO) 20 MG tablet    Sig: Take 1 tablet (20 mg total) by mouth daily.    Dispense:  90 tablet    Refill:  1  . diclofenac sodium (VOLTAREN) 1 % GEL    Sig: Apply 4 g topically 4 (four) times daily. To affected joint.    Dispense:  100 g    Refill:  11    Historical information moved to improve visibility of documentation.  Past Medical History:  Diagnosis Date  . Arthritis   . Gout   . Hyperlipidemia   . Hypertension   . Hypertriglyceridemia 05/23/2018  . Sleep apnea   . Vitamin D  deficiency 05/23/2018   Past Surgical History:  Procedure Laterality Date  . APPENDECTOMY    . NASAL SEPTUM SURGERY    . TURBINATE RESECTION     Social History   Tobacco Use  . Smoking status: Never Smoker  . Smokeless tobacco: Former Systems developer    Types: Chew  Substance Use Topics  . Alcohol use: Yes    Alcohol/week: 1.0 standard drinks    Types: 1 Standard drinks or equivalent per week   family history includes Diabetes in his father; Heart disease in his father and sister; Hypertension in his father and paternal uncle; Stroke in his father and paternal uncle.  Medications: Current Outpatient Medications  Medication Sig Dispense Refill  . doxepin (SINEQUAN) 50 MG capsule Take 1 capsule (50 mg total) by mouth at bedtime. 30 capsule 3  . escitalopram (LEXAPRO) 20 MG tablet Take 1 tablet (20 mg total) by mouth daily. 90 tablet 1  . diclofenac sodium (VOLTAREN) 1 % GEL Apply 4 g topically 4 (four) times daily. To affected joint. 100 g 11   No current facility-administered medications for this visit.    Allergies  Allergen Reactions  . Amlodipine Other (See Comments)    Erectile dysfunction  . Other Rash    OTC soaps/shampoos  . Wellbutrin [Bupropion] Other (See Comments)    Worsening anxiety      Discussed warning signs or symptoms. Please see discharge instructions. Patient expresses understanding.

## 2018-08-11 LAB — URIC ACID: Uric Acid, Serum: 7 mg/dL (ref 4.0–8.0)

## 2018-08-19 ENCOUNTER — Ambulatory Visit (INDEPENDENT_AMBULATORY_CARE_PROVIDER_SITE_OTHER): Payer: BLUE CROSS/BLUE SHIELD | Admitting: Physician Assistant

## 2018-08-19 ENCOUNTER — Encounter: Payer: Self-pay | Admitting: Physician Assistant

## 2018-08-19 ENCOUNTER — Other Ambulatory Visit: Payer: Self-pay

## 2018-08-19 ENCOUNTER — Ambulatory Visit (INDEPENDENT_AMBULATORY_CARE_PROVIDER_SITE_OTHER): Payer: BLUE CROSS/BLUE SHIELD

## 2018-08-19 VITALS — BP 116/77 | HR 68 | Temp 98.6°F | Wt 202.0 lb

## 2018-08-19 DIAGNOSIS — M25562 Pain in left knee: Secondary | ICD-10-CM

## 2018-08-19 MED ORDER — PENNSAID 2 % TD SOLN: 2.0000 | Freq: Two times a day (BID) | TRANSDERMAL | 0 refills | Status: DC

## 2018-08-19 MED ORDER — DICLOFENAC SODIUM 2 % TD SOLN: 2.0000 | Freq: Two times a day (BID) | TRANSDERMAL | 3 refills | Status: AC

## 2018-08-19 NOTE — Progress Notes (Signed)
HPI:                                                                Ricky HarpChristopher T Dalton is a 28 y.o. male who [facility] today for left knee pain  Onset: 2 weeks ago Atraumatic, left medial knee pain worse with activity and knee flexion. Reports pain is interfering with his activities. Pain is moderate-persistent.  Typically he gets relief with a steroid injection, but this time he reports no improvement in his pain. He states in the past he has had recurrent knee pain and received steroid injections approx once per year through the [facility]. He was also given an Rx for Diclofenac gel but it was not approved by insurance Treatment tried: Ibuprofen, steroid injection 08/10/18 with Sports Medicine   Past Medical History:  Diagnosis Date  . Arthritis   . Gout   . Hyperlipidemia   . Hypertension   . Hypertriglyceridemia 05/23/2018  . Sleep apnea   . Vitamin D deficiency 05/23/2018   Past Surgical History:  Procedure Laterality Date  . APPENDECTOMY    . NASAL SEPTUM SURGERY    . TURBINATE RESECTION     Social History   Tobacco Use  . Smoking status: Never Smoker  . Smokeless tobacco: Former NeurosurgeonUser    Types: Chew  Substance Use Topics  . Alcohol use: Yes    Alcohol/week: 1.0 standard drinks    Types: 1 Standard drinks or equivalent per week   family history includes Diabetes in his father; Heart disease in his father and sister; Hypertension in his father and paternal uncle; Stroke in his father and paternal uncle.    ROS: negative except as noted in the HPI  Medications: Current Outpatient Medications  Medication Sig Dispense Refill  . doxepin (SINEQUAN) 50 MG capsule Take 1 capsule (50 mg total) by mouth at bedtime. 30 capsule 3  . escitalopram (LEXAPRO) 20 MG tablet Take 1 tablet (20 mg total) by mouth daily. 90 tablet 1  . Diclofenac Sodium (PENNSAID) 2 % SOLN Place 2 Pump onto the skin 2 (two) times a day. LOT  [id number],EXP 04/2019 8 g 0  . Diclofenac Sodium 2 % SOLN Place 2 Pump onto the skin 2 (two) times daily. 112 g 3   No current facility-administered medications for this visit.    Allergies  Allergen Reactions  . Amlodipine Other (See Comments)    Erectile dysfunction  . Other Rash    OTC soaps/shampoos  . Wellbutrin [Bupropion] Other (See Comments)    Worsening anxiety       Objective:  BP 116/77   Pulse 68   Temp 98.6 F (37 C) (Oral)   Wt 202 lb (91.6 kg)   BMI 25.94 kg/m  Gen:  alert, not ill-appearing, no distress, appropriate for age MSK: extremities atraumatic, antalgic gait and station Left knee: atraumatic, no edema, medial joint line tenderness   No results found for this or any previous visit (from the past 72 hour(s)). No results found.    Assessment and Plan: 28 y.o. male with   .Ricky DeerChristopher was seen today for knee pain.  Diagnoses and all orders for this visit:  Acute pain of left knee -  Diclofenac Sodium 2 % SOLN; Place 2 Pump onto the skin 2 (two) times daily. -     Ambulatory referral to Physical Therapy -     DG Knee Complete 4 Views Left  Other orders -     Diclofenac Sodium (PENNSAID) 2 % SOLN; Place 2 Pump onto the skin 2 (two) times a day. LOT [id number],EXP 04/2019  2 weeks of persistent atraumatic medial knee pain. Failed corticosteroid injection X-ray today Knee brace (Tru-pull lite, size L) fitted and applied in office Pennsaid samples given to patient and sent to mail order pharmacy Referral placed to PT If no improvement with 4 weeks of PT, will obtain knee MRI  Patient education and anticipatory guidance given Patient agrees with treatment plan Follow-up as needed if symptoms worsen or fail to improve  Darlyne Russian PA-C

## 2018-08-23 ENCOUNTER — Telehealth: Payer: Self-pay | Admitting: Family Medicine

## 2018-08-23 DIAGNOSIS — M25562 Pain in left knee: Secondary | ICD-10-CM

## 2018-08-23 NOTE — Telephone Encounter (Signed)
Given continued knee pain.  Patient failed conservative management including injection.  X-ray is unremarkable.  Plan to proceed with MRI.  Patient does have mechanical symptoms on exam.

## 2018-08-23 NOTE — Telephone Encounter (Signed)
-----   Message from [facility], [place] sent at 08/23/2018  8:57 AM EDT ----- Would you be willing to order the knee MRI? ----- Message ----- From: Gregor Hams, MD Sent: 08/23/2018   7:02 AM EDT To: Trixie Dredge, PA-C  I think getting an MRI at this point makes sense.  We do not know what is wrong with his knee.  X-rays look pretty normal besides an effusion and his rheumatologic work-up was negative.  Uric acid is not very high either.  Even if he had gout he should be responding well to steroid injection at this point.  I agree with MRI.  Ellard Artis ----- Message ----- From: Trixie Dredge, PA-C Sent: 08/22/2018   9:52 AM EDT To: Gregor Hams, MD, Delrae Alfred, CMA, #  Dr. Georgina Snell, You injected this patient a couple of weeks. Please see my follow-up note. Do you want him to proceed with knee MRI now or wait for 4 weeks of PT?

## 2018-08-23 NOTE — Telephone Encounter (Signed)
MRI auth completed and approved. Info given to [facility] to update order and contact imaging

## 2018-08-28 ENCOUNTER — Ambulatory Visit (INDEPENDENT_AMBULATORY_CARE_PROVIDER_SITE_OTHER): Payer: BLUE CROSS/BLUE SHIELD

## 2018-08-28 ENCOUNTER — Other Ambulatory Visit: Payer: Self-pay

## 2018-08-28 DIAGNOSIS — M25562 Pain in left knee: Secondary | ICD-10-CM | POA: Diagnosis not present

## 2018-09-29 ENCOUNTER — Encounter: Payer: Self-pay | Admitting: Physician Assistant

## 2018-09-29 ENCOUNTER — Telehealth (INDEPENDENT_AMBULATORY_CARE_PROVIDER_SITE_OTHER): Payer: BLUE CROSS/BLUE SHIELD | Admitting: Physician Assistant

## 2018-09-29 VITALS — BP 120/70 | Temp 98.6°F | Wt 202.0 lb

## 2018-09-29 DIAGNOSIS — J029 Acute pharyngitis, unspecified: Secondary | ICD-10-CM

## 2018-09-29 DIAGNOSIS — K122 Cellulitis and abscess of mouth: Secondary | ICD-10-CM

## 2018-09-29 DIAGNOSIS — J039 Acute tonsillitis, unspecified: Secondary | ICD-10-CM

## 2018-09-29 DIAGNOSIS — J358 Other chronic diseases of tonsils and adenoids: Secondary | ICD-10-CM | POA: Diagnosis not present

## 2018-09-29 MED ORDER — AMOXICILLIN-POT CLAVULANATE 875-125 MG PO TABS: 1.0000 | ORAL_TABLET | Freq: Two times a day (BID) | ORAL | 0 refills | Status: AC

## 2018-09-29 MED ORDER — DEXAMETHASONE 2 MG PO TABS: 10.0000 mg | ORAL_TABLET | Freq: Once | ORAL | 0 refills | Status: AC

## 2018-09-29 NOTE — Progress Notes (Signed)
Virtual Visit via Video Note  I connected with Ricky Dalton on 09/29/18 at  8:50 AM EDT by a video enabled telemedicine application and verified that I am speaking with the correct person using two identifiers.   I discussed the limitations of evaluation and management by telemedicine and the availability of in person appointments. The patient expressed understanding and agreed to proceed.  History of Present Illness: HPI:                                                                Ricky Dalton is a 28 y.o. male   CC: sore throat  Sore Throat  This is a new problem. The current episode started in the past 7 days (Wednesday night). The problem has been unchanged. Neither side of throat is experiencing more pain than the other. There has been no fever. The pain is moderate. Associated symptoms include a hoarse voice, swollen glands and trouble swallowing (tolerating liquids and secretions). Pertinent negatives include no congestion, coughing, drooling, ear pain, shortness of breath, stridor or vomiting. Associated symptoms comments: + enlarged tonsils + uvula swollen. He has had exposure to strep (possible exposure (son)).    Reports left tonsil is always 2-3 times larger than his right tonsil. He also reports that he has recurrent tonsil stones.   Past Medical History:  Diagnosis Date  . Arthritis   . Gout   . Hyperlipidemia   . Hypertension   . Hypertriglyceridemia 05/23/2018  . Sleep apnea   . Vitamin D deficiency 05/23/2018   Past Surgical History:  Procedure Laterality Date  . APPENDECTOMY    . NASAL SEPTUM SURGERY    . TURBINATE RESECTION     Social History   Tobacco Use  . Smoking status: Never Smoker  . Smokeless tobacco: Former NeurosurgeonUser    Types: Chew  Substance Use Topics  . Alcohol use: Yes    Alcohol/week: 1.0 standard drinks    Types: 1 Standard drinks or equivalent per week   family history includes Diabetes in his father; Heart disease in  his father and sister; Hypertension in his father and paternal uncle; Stroke in his father and paternal uncle.    ROS: negative except as noted in the HPI  Medications: Current Outpatient Medications  Medication Sig Dispense Refill  . Diclofenac Sodium 2 % SOLN Place 2 Pump onto the skin 2 (two) times daily. 112 g 3  . doxepin (SINEQUAN) 50 MG capsule Take 1 capsule (50 mg total) by mouth at bedtime. 30 capsule 3  . escitalopram (LEXAPRO) 20 MG tablet Take 1 tablet (20 mg total) by mouth daily. 90 tablet 1   No current facility-administered medications for this visit.    Allergies  Allergen Reactions  . Amlodipine Other (See Comments)    Erectile dysfunction  . Other Rash    OTC soaps/shampoos  . Wellbutrin [Bupropion] Other (See Comments)    Worsening anxiety       Objective:  BP 120/70 Comment: Pt unable to get a BP.  Temp 98.6 F (37 C) (Oral)   Wt 202 lb (91.6 kg)   BMI 25.94 kg/m  Gen:  alert, not ill-appearing, no distress, appropriate for age HEENT: head normocephalic without obvious abnormality, conjunctiva and cornea clear, trachea midline  Pulm: Normal work of breathing, normal phonation Neuro: alert and oriented x 3 Psych: cooperative, speech is articulate, normal rate and volume; thought processes clear and goal-directed, normal judgment, good insight    No results found for this or any previous visit (from the past 72 hour(s)). No results found.    Assessment and Plan: 28 y.o. male with   .Ricky Dalton was seen today for sore throat.  Diagnoses and all orders for this visit:  Uvulitis -     amoxicillin-clavulanate (AUGMENTIN) 875-125 MG tablet; Take 1 tablet by mouth 2 (two) times daily for 5 days. -     dexamethasone (DECADRON) 2 MG tablet; Take 5 tablets (10 mg total) by mouth once for 1 dose.  Tonsillith -     Ambulatory referral to ENT  Tonsillopharyngitis -     amoxicillin-clavulanate (AUGMENTIN) 875-125 MG tablet; Take 1 tablet by mouth  2 (two) times daily for 5 days. -     dexamethasone (DECADRON) 2 MG tablet; Take 5 tablets (10 mg total) by mouth once for 1 dose.  Tonsil asymmetry -     Ambulatory referral to ENT   Patient provided vital signs reviewed Physical exam limited by video visit Patient provided a photo of his oropharynx showing inflamed uvula that is midline and enlarged, red tonsils. Oropharynx is patent.  Centor score=3, possible Strep exposure Treating empirically for GAS with Augmentin x 5 days and treating uvulitis with single dose of Decadron 10 mg Counseled on signs/symptoms of peritonsillar abscess  Referral to ENT for recurrent tonsillith and tonsil asymmetry   Follow Up Instructions:    I discussed the assessment and treatment plan with the patient. The patient was provided an opportunity to ask questions and all were answered. The patient agreed with the plan and demonstrated an understanding of the instructions.   The patient was advised to call back or seek an in-person evaluation if the symptoms worsen or if the condition fails to improve as anticipated.  I provided 10 minutes of non-face-to-face time during this encounter.   Trixie Dredge, [place]

## 2018-12-28 ENCOUNTER — Emergency Department (INDEPENDENT_AMBULATORY_CARE_PROVIDER_SITE_OTHER)
Admission: EM | Admit: 2018-12-28 | Discharge: 2018-12-28 | Disposition: A | Discharge status: Home / Self Care | Payer: BLUE CROSS/BLUE SHIELD | Source: Home / Self Care

## 2018-12-28 ENCOUNTER — Encounter: Payer: Self-pay | Admitting: Emergency Medicine

## 2018-12-28 ENCOUNTER — Other Ambulatory Visit: Payer: Self-pay

## 2018-12-28 DIAGNOSIS — R21 Rash and other nonspecific skin eruption: Secondary | ICD-10-CM

## 2018-12-28 MED ORDER — DEXAMETHASONE SODIUM PHOSPHATE 10 MG/ML IJ SOLN
10.0000 mg | Freq: Once | INTRAMUSCULAR | Status: AC
  Administered 2018-12-28: 13:00:00 10 mg via INTRAMUSCULAR

## 2018-12-28 MED ORDER — TRIAMCINOLONE ACETONIDE 0.1 % EX CREA: 1.0000 "application " | TOPICAL_CREAM | Freq: Two times a day (BID) | CUTANEOUS | 0 refills | Status: AC

## 2018-12-28 MED ORDER — PREDNISONE 50 MG PO TABS: 50.0000 mg | ORAL_TABLET | Freq: Every day | ORAL | 0 refills | Status: AC

## 2018-12-28 MED ORDER — CETIRIZINE HCL 10 MG PO TABS: 10.0000 mg | ORAL_TABLET | Freq: Every day | ORAL | 0 refills | Status: AC

## 2018-12-28 NOTE — Discharge Instructions (Signed)
°  You were given a shot of decadron (a steroid) today to help with itching and swelling from a likely allergic reaction.  You have been prescribed 5 days of prednisone, an oral steroid.  You may start this medication tomorrow with breakfast.   ° °

## 2018-12-28 NOTE — ED Triage Notes (Signed)
Rash on hands and buttocks x days, red, itchy and painful

## 2018-12-28 NOTE — ED Provider Notes (Signed)
[facility]    CSN: [id number] Arrival date & time: 12/28/18  1149      History   Chief Complaint Chief Complaint  Patient presents with  . Rash    HPI Ricky Dalton is a 28 y.o. male.   HPI  Ricky Dalton is a 28 y.o. male presenting to UC with c/o itchy red rash on hands and buttock that started a few days ago.  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 He has also had sensitive skin recently and believes he may be allergic to something he touched recently.  He has tried cortisone cream w/o relief. Denies oral swelling, scratchy throat or difficulty breathing. No other rashes. No contact with others with a rash.    Past Medical History:  Diagnosis Date  . Arthritis   . Gout   . Hyperlipidemia   . Hypertension   . Hypertriglyceridemia 05/23/2018  . Sleep apnea   . Vitamin D deficiency 05/23/2018    Patient Active Problem List   Diagnosis Date Noted  . Vitamin D deficiency 05/23/2018  . Hypertriglyceridemia 05/23/2018  . Bilateral chronic knee pain 04/26/2018  . Family history of rheumatoid arthritis 04/26/2018  . Mixed hyperlipidemia 04/26/2018  . Fatigue 04/26/2018  . History of sleep apnea 04/26/2018  . 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 Anxiety disorder 10/18/2017  . 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 Other microscopic hematuria 09/27/2017  . 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 Hypertension goal BP (blood pressure) < 130/80 09/27/2017  . Microalbuminuria 09/27/2017    Past Surgical History:  Procedure Laterality Date  . APPENDECTOMY    . NASAL SEPTUM SURGERY    . TURBINATE RESECTION         Home Medications    Prior to Admission medications   Medication Sig Start Date End Date Taking? Authorizing Provider  cetirizine (ZYRTEC) 10 MG tablet Take 1 tablet (10 mg total) by mouth daily. 12/28/18   Noe Gens, PA-C  Diclofenac  Sodium 2 % SOLN Place 2 Pump onto the skin 2 (two) times daily. 08/19/18   Trixie Dredge, PA-C  escitalopram (LEXAPRO) 20 MG tablet Take 1 tablet (20 mg total) by mouth daily. 08/10/18   Gregor Hams, MD  predniSONE (DELTASONE) 50 MG tablet Take 1 tablet (50 mg total) by mouth daily with breakfast for 5 days. 12/28/18 01/02/19  Noe Gens, PA-C  triamcinolone cream (KENALOG) 0.1 % Apply 1 application topically 2 (two) times daily. 12/28/18   Noe Gens, PA-C    Family History Family History  Problem Relation Age of Onset  . Stroke Father   . Hypertension Father   . Heart disease Father   . Diabetes Father   . Stroke Paternal Uncle   . Hypertension Paternal Uncle   . Heart disease Sister     Social History Social History   Tobacco Use  . Smoking status: Never Smoker  . Smokeless tobacco: Current User    Types: Chew  Substance Use Topics  . Alcohol use: Yes    Alcohol/week: 1.0 standard drinks    Types: 1 Standard drinks or equivalent per week  . Drug use: No     Allergies   Amlodipine, Other, and Wellbutrin [bupropion]   Review of Systems Review of Systems  Constitutional: Negative for chills and fever.  Musculoskeletal: Negative for arthralgias and myalgias.  Skin: Positive for rash. Negative for wound.  Physical Exam Triage Vital Signs ED Triage Vitals  Enc Vitals Group     BP 12/28/18 1207 127/81     Pulse Rate 12/28/18 1207 64     Resp --      Temp 12/28/18 1207 98.2 F (36.8 C)     Temp Source 12/28/18 1207 Oral     SpO2 12/28/18 1207 97 %     Weight 12/28/18 1208 192 lb (87.1 kg)     Height 12/28/18 1208 6\' 2"  (1.88 m)     Head Circumference --      Peak Flow --      Pain Score 12/28/18 1208 8     Pain Loc --      Pain Edu? --      Excl. in GC? --    No data found.  Updated Vital Signs BP 127/81 (BP Location: Right Arm)   Pulse 64   Temp 98.2 F (36.8 C) (Oral)   Ht 6\' 2"  (1.88 m)   Wt 192 lb (87.1 kg)   SpO2 97%    BMI 24.65 kg/m   Visual Acuity Right Eye Distance:   Left Eye Distance:   Bilateral Distance:    Right Eye Near:   Left Eye Near:    Bilateral Near:     Physical Exam Vitals signs and nursing note reviewed.  Constitutional:      Appearance: Normal appearance. He is well-developed.  HENT:     Head: Normocephalic and atraumatic.  Neck:     Musculoskeletal: Normal range of motion.  Cardiovascular:     Rate and Rhythm: Normal rate.  Pulmonary:     Effort: Pulmonary effort is normal.  Musculoskeletal: Normal range of motion.  Skin:    General: Skin is warm and dry.     Findings: Erythema and rash present.     Comments: Bilateral hands: diffuse erythematous rash, small tender papules. No discharge or bleeding. Rash worst on fingers. Buttock: diffuse erythematous macular rash. Non-tender. No sores or vesicles. No bleeding, drainage or induration.  Neurological:     Mental Status: He is alert and oriented to person, place, and time.  Psychiatric:        Behavior: Behavior normal.      UC Treatments / Results  Labs (all labs ordered are listed, but only abnormal results are displayed) Labs Reviewed - No data to display  EKG   Radiology No results found.  Procedures Procedures (including critical care time)  Medications Ordered in UC Medications  dexamethasone (DECADRON) injection 10 mg (10 mg Intramuscular Given 12/28/18 1258)    Initial Impression / Assessment and Plan / UC Course  I have reviewed the triage vital signs and the nursing notes.  Pertinent labs & imaging results that were available during my care of the patient were reviewed by me and considered in my medical decision making (see chart for details).     Hx and exam most c/w a contact dermatitis Rash likely transferred from source on hands to buttock while pt wiped himself after using the bathroom. Will tx as local allergic reaction AVS provided  Final Clinical Impressions(s) / UC Diagnoses    Final diagnoses:  Rash and nonspecific skin eruption     Discharge Instructions      You were given a shot of decadron (a steroid) today to help with itching and swelling from a likely allergic reaction.  You have been prescribed 5 days of prednisone, an oral steroid.  You may start this  medication tomorrow with breakfast.       ED Prescriptions    Medication Sig Dispense Auth. Provider   triamcinolone cream (KENALOG) 0.1 % Apply 1 application topically 2 (two) times daily. 30 g Doroteo GlassmanPhelps, Marena Witts O, PA-C   predniSONE (DELTASONE) 50 MG tablet Take 1 tablet (50 mg total) by mouth daily with breakfast for 5 days. 5 tablet Waylan RocherPhelps, Yogesh Cominsky O, PA-C   cetirizine (ZYRTEC) 10 MG tablet Take 1 tablet (10 mg total) by mouth daily. 30 tablet Lurene ShadowPhelps, Benjamin Casanas O, [place]PA-C     PDMP not reviewed this encounter.   Lurene Shadowhelps, Montravious Weigelt O, [place]-C 12/28/18 1926

## 2019-07-17 NOTE — Progress Notes (Signed)
Subjective:    CC: Establish care, review of service injuries  HPI: Very pleasant 29 year old male presenting today to establish care with a new PCP to discuss anxiety/depression.  [facility] but [facility] [place] is not covering his anxiety/depression medications.  He is requesting a letter addressed to the [place] regarding the etiology and onset of his anxiety/depression.  Reports he was in the [employer] for 10 years, in [place] in 2011 in [place] in 2012.  While he was overseas he lost 4 family members a 45-month time.  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  Prior to his service in the [employer] he did not have any history of mental health concerns.  He has been taking Lexapro 20 mg daily which she reports helps with his symptoms to a point.  He reports starting to smoke marijuana on a regular basis over the past year.  Endorses occasional suicide thoughts.  Reports 2 months ago he locked himself in a closet with a gun.  Reports that having 2 small kids at home dependent on him keeps him from going through with his suicidal thoughts.  Anxiety is causing difficulty in his everyday life as he has had to quit a job due to being unable to tolerate being around people and he has difficulty driving without significant anxiety.  No current relationship with counseling but he reports giving thought to starting counseling.  I reviewed the past medical history, family history, social history, surgical history, and allergies today and no changes were needed.  Please see the problem list section below in epic for further details.  Past Medical History: Past Medical History:  Diagnosis Date  . Anxiety   . Arthritis   . Gout   . Hyperlipidemia   . Hypertension   . Hypertriglyceridemia 05/23/2018  . Kidney stones   . Sleep apnea   . Vitamin D deficiency 05/23/2018   Past Surgical History: Past Surgical History:  Procedure  Laterality Date  . APPENDECTOMY    . NASAL SEPTUM SURGERY    . TURBINATE RESECTION     Social History: Social History   Socioeconomic History  . Marital status: Married    Spouse name: Not on file  . Number of children: Not on file  . Years of education: Not on file  . Highest education level: Not on file  Occupational History  . Not on file  Tobacco Use  . Smoking status: Never Smoker  . Smokeless tobacco: Former Systems developer    Types: Chew  Substance and Sexual Activity  . Alcohol use: Not Currently  . Drug use: Yes    Types: Marijuana    Comment: "regular user"  . Sexual activity: Yes    Partners: Female    Birth control/protection: Surgical  Other Topics Concern  . Not on file  Social History Narrative  . Not on file   Social Determinants of Health   Financial Resource Strain:   . Difficulty of Paying Living Expenses:   Food Insecurity:   . Worried About Charity fundraiser in the Last Year:   . Arboriculturist in the Last Year:   Transportation Needs:   . Film/video editor (Medical):   Marland Kitchen Lack of Transportation (Non-Medical):   Physical Activity:   . Days of Exercise per Week:   . Minutes of Exercise per Session:   Stress:   . Feeling  of Stress :   Social Connections:   . Frequency of Communication with Friends and Family:   . Frequency of Social Gatherings with Friends and Family:   . Attends Religious Services:   . Active Member of Clubs or Organizations:   . Attends Banker Meetings:   Marland Kitchen Marital Status:    Family History: Family History  Problem Relation Age of Onset  . Stroke Father   . Hypertension Father   . Heart disease Father   . Diabetes Father   . Stroke Paternal Uncle   . Hypertension Paternal Uncle   . Heart disease Sister    Allergies: Allergies  Allergen Reactions  . Amlodipine Other (See Comments)    Erectile dysfunction  . Other Rash    OTC soaps/shampoos  . Wellbutrin [Bupropion] Other (See Comments)     Worsening anxiety   Medications: See med rec.  Review of Systems: See HPI for pertinent positives and negatives.  Objective:    General: Well Developed, well nourished, and in no acute distress.  Neuro: Alert and oriented x3.  HEENT: Normocephalic, atraumatic.  Skin: Warm and dry. Cardiac: Regular rate and rhythm, no murmurs rubs or gallops, no lower extremity edema.  Respiratory: Clear to auscultation bilaterally. Not using accessory muscles, speaking in full sentences.   Impression and Recommendations:    1. Encounter to establish care Reviewed medical records and discussed care needs with patient.  He will continue to receive care at the [facility] and see [place] as needed.  2. Anxiety disorder, unspecified type From discussion with patient and timing of anxiety/depression onset, it is very likely that his service time was the contributing etiology.  We will send a letter to the patient via MyChart address to the [place] indicating this in the hopes that they will begin to cover his anxiety/depression medications.  Return if symptoms worsen or fail to improve. ___________________________________________ Thayer Ohm, DNP, APRN, FNP-BC Primary Care and Sports Medicine [facility]

## 2019-07-18 ENCOUNTER — Ambulatory Visit (INDEPENDENT_AMBULATORY_CARE_PROVIDER_SITE_OTHER): Payer: BLUE CROSS/BLUE SHIELD | Admitting: Medical-Surgical

## 2019-07-18 ENCOUNTER — Other Ambulatory Visit: Payer: Self-pay

## 2019-07-18 ENCOUNTER — Encounter: Payer: Self-pay | Admitting: Medical-Surgical

## 2019-07-18 VITALS — BP 124/78 | HR 63 | Temp 98.0°F | Ht 71.0 in | Wt 208.2 lb

## 2019-07-18 DIAGNOSIS — Z7689 Persons encountering health services in other specified circumstances: Secondary | ICD-10-CM | POA: Diagnosis not present

## 2019-07-18 DIAGNOSIS — F419 Anxiety disorder, unspecified: Secondary | ICD-10-CM | POA: Diagnosis not present

## 2020-07-24 IMAGING — DX LEFT KNEE - COMPLETE 4+ VIEW
4 series · 4 of 4 positions shown · non-contrast
Comparison: None.

CLINICAL DATA: Atraumatic left knee pain for 2 weeks. Acute left
knee pain.

EXAM:
LEFT KNEE - COMPLETE 4+ VIEW

[knee ap]
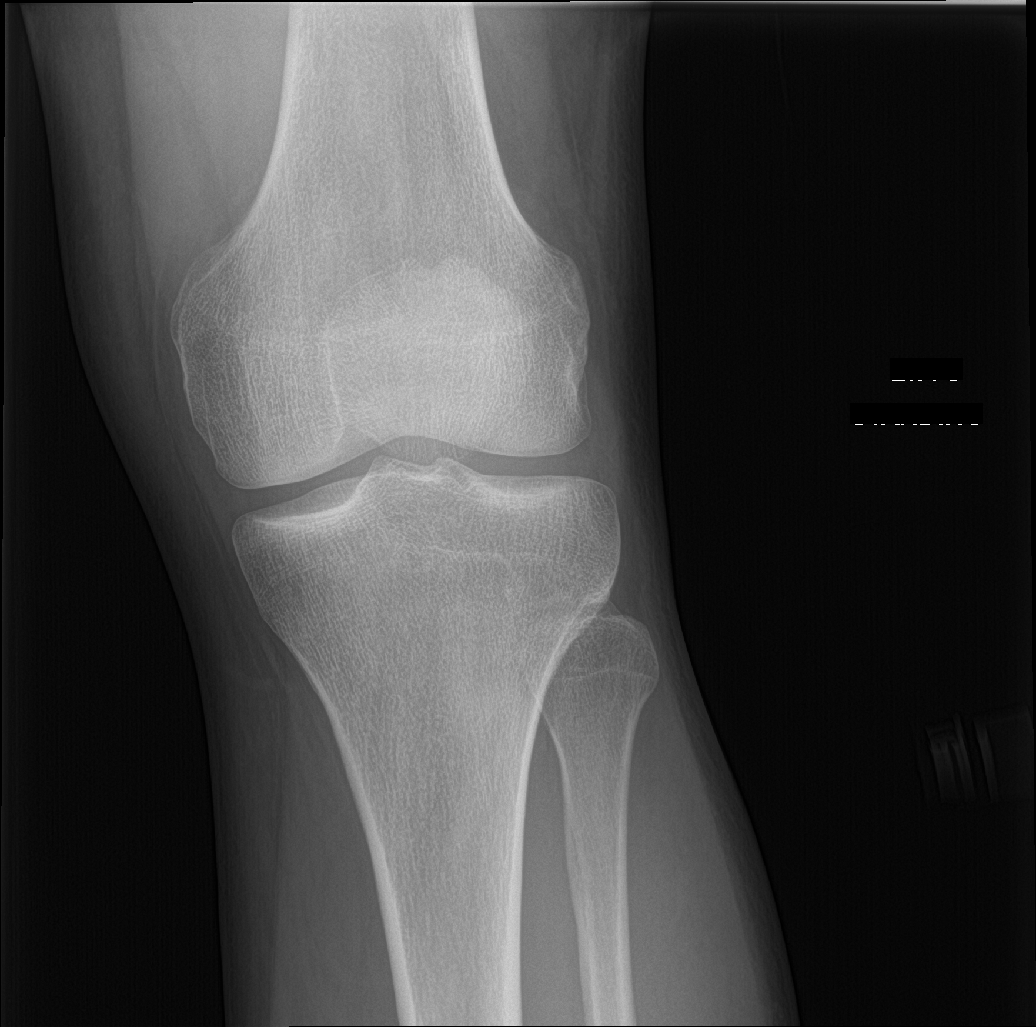

[tunnel]
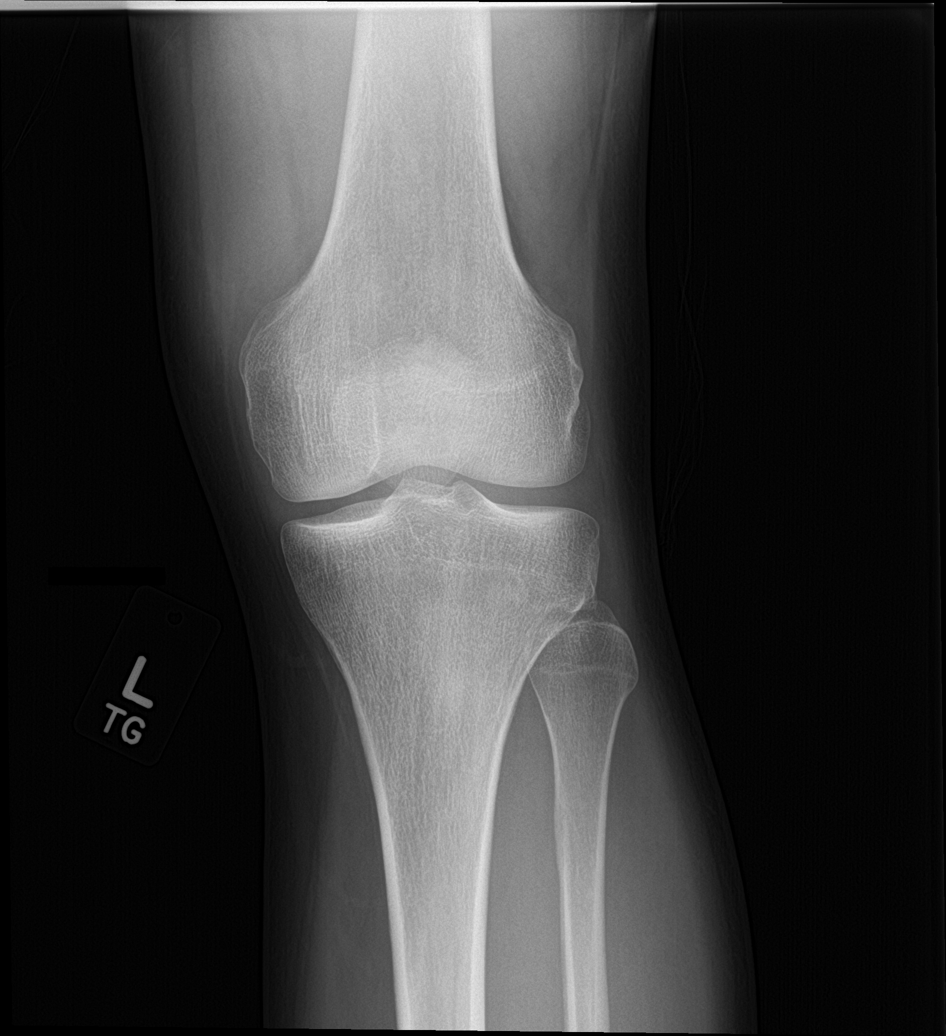

[knee lat]
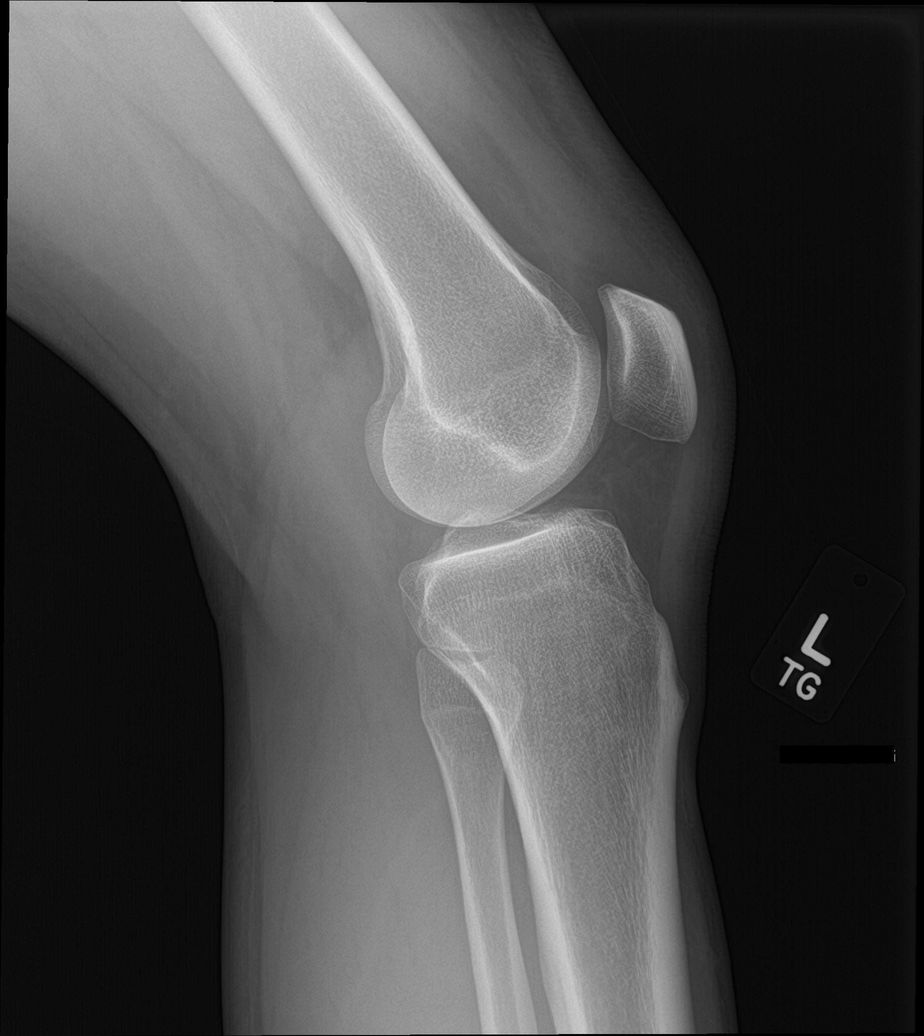

[knee sunrise]
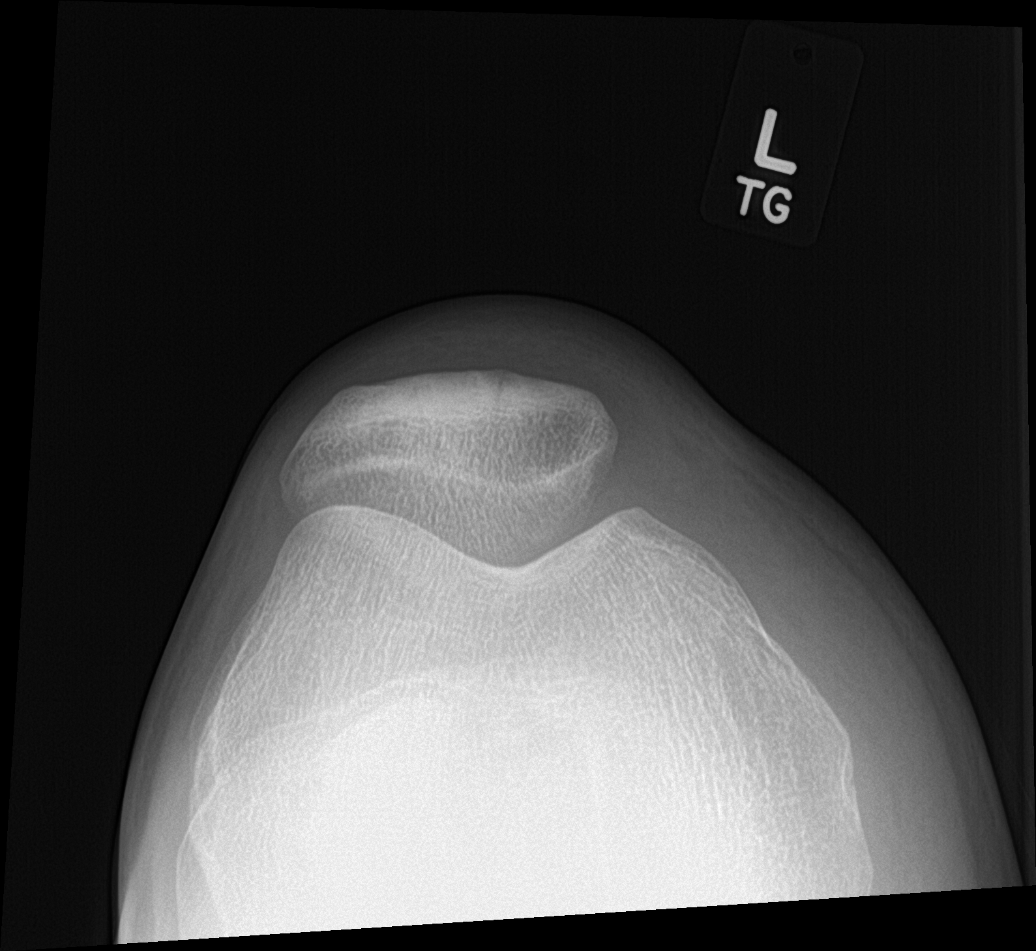

[4 of 4 positions shown; findings below may reference images not displayed]

FINDINGS: No evidence of fracture or dislocation. No osseous erosions or bony
destructive change. No evidence of arthropathy or other focal bone
abnormality. Normal joint spaces. Trace joint effusion. Soft tissues
are unremarkable.
IMPRESSION: Trace joint effusion. No acute osseous abnormality.

## 2020-08-02 IMAGING — MR MRI OF THE LEFT KNEE WITHOUT CONTRAST
7 series · 40 of 40 positions shown · non-contrast
Comparison: Radiographs dated 08/19/2018

CLINICAL DATA: Left knee pain.

EXAM:
MRI OF THE LEFT KNEE WITHOUT CONTRAST
TECHNIQUE: Multiplanar, multisequence MR imaging of the knee was performed. No
intravenous contrast was administered.

[Series 3: T2 fat-sat · axial · 4.0mm · 0.53mm/px · z∈[-87,+83]mm · 6 of 35 slices shown (1 of 3)]
[im 1/35]
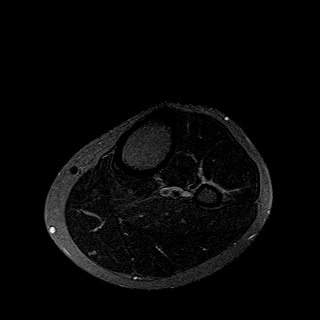
[im 7/35]
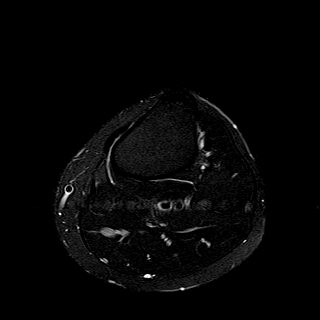
[im 14/35]
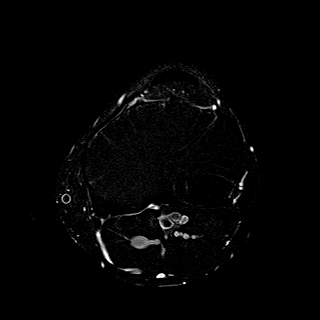
[im 21/35]
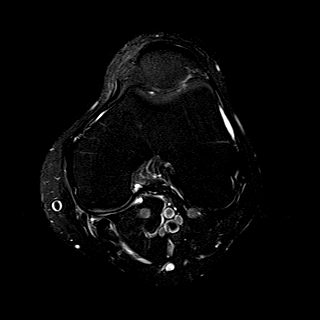
[im 28/35]
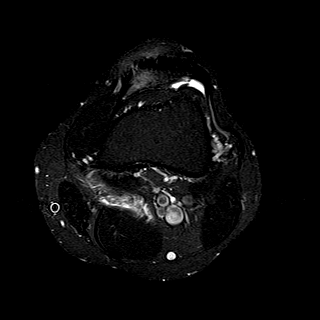
[im 35/35]
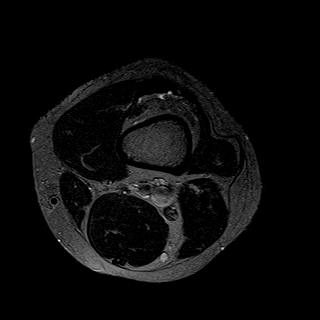

[Series 4: T1 · coronal · 4.0mm · 0.62mm/px · 6 of 28 slices shown]
[im 1/28]
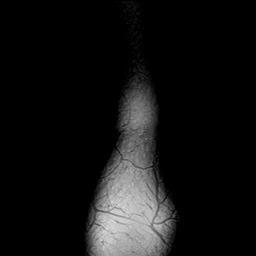
[im 6/28]
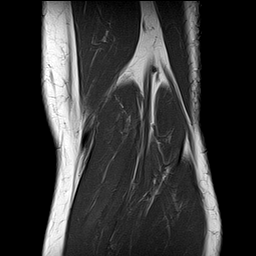
[im 11/28]
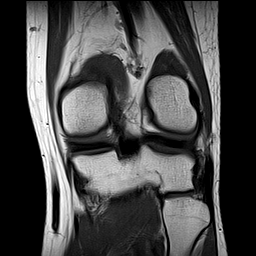
[im 17/28]
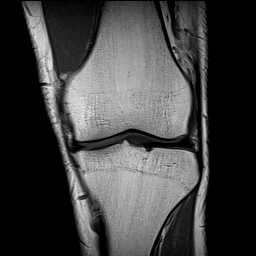
[im 22/28]
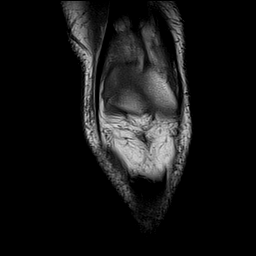
[im 28/28]
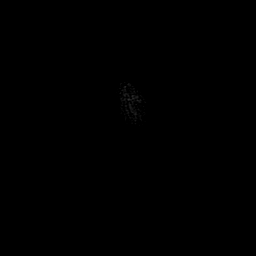

[Series 5: T2 fat-sat · coronal · 4.0mm · 0.62mm/px · 6 of 28 slices shown (2 of 3)]
[im 1/28]
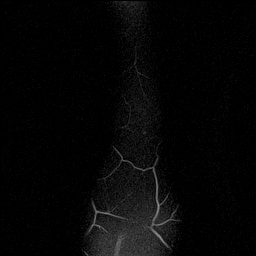
[im 6/28]
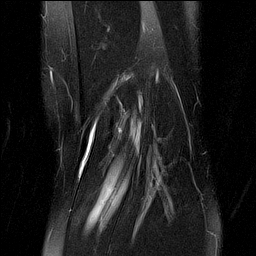
[im 11/28]
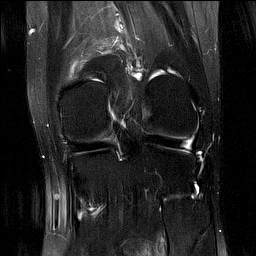
[im 17/28]
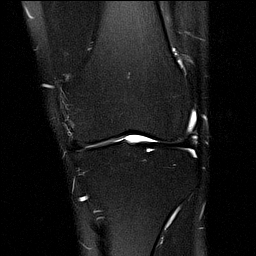
[im 22/28]
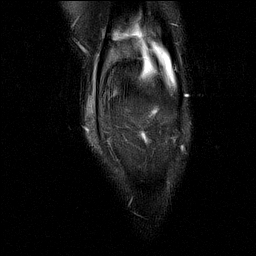
[im 28/28]
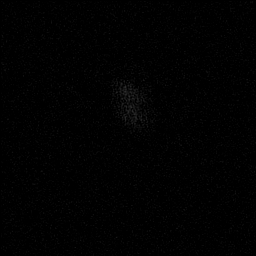

[Series 6: PD fat-sat · coronal · 4.0mm · 0.62mm/px · 6 of 28 slices shown (1 of 3)]
[im 1/28]
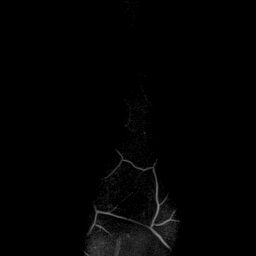
[im 6/28]
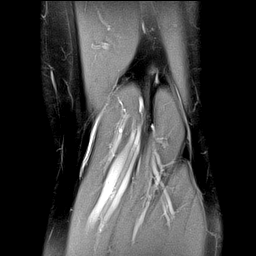
[im 11/28]
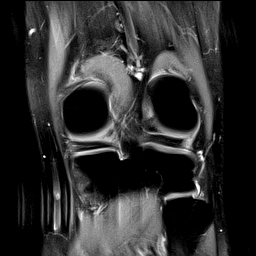
[im 17/28]
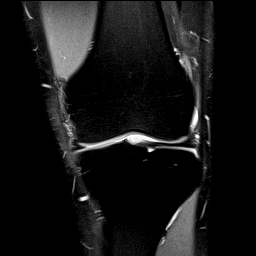
[im 22/28]
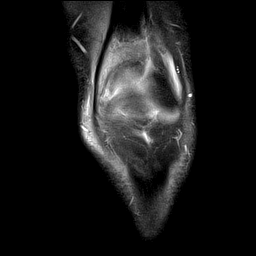
[im 28/28]
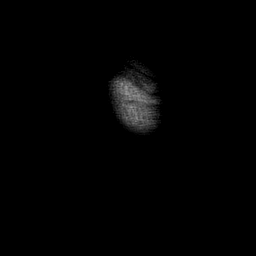

[Series 7: PD fat-sat · sagittal · 3.0mm · 0.62mm/px · 6 of 32 slices shown (2 of 3)]
[im 1/32]
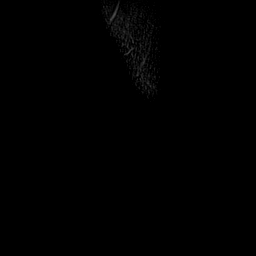
[im 7/32]
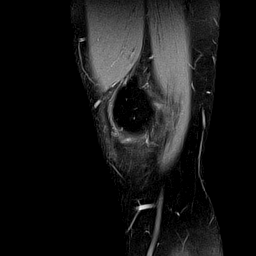
[im 13/32]
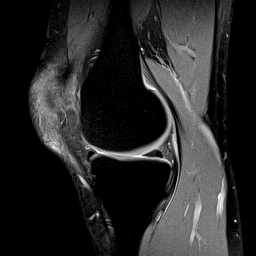
[im 19/32]
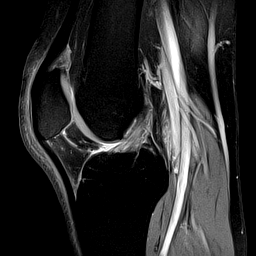
[im 25/32]
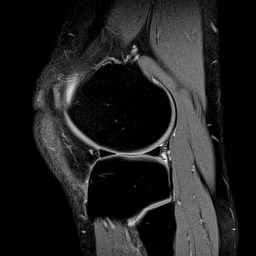
[im 32/32]
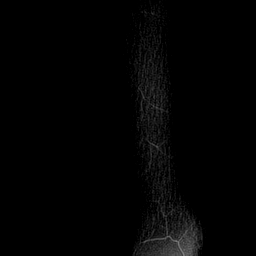

[Series 8: T2 fat-sat · sagittal · 3.0mm · 0.62mm/px · 6 of 32 slices shown (3 of 3)]
[im 1/32]
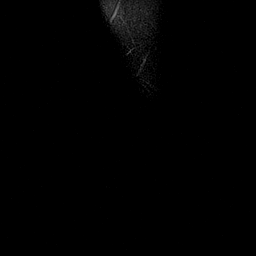
[im 7/32]
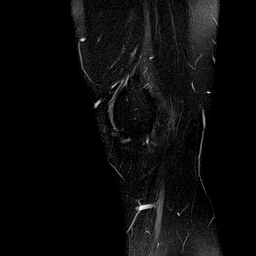
[im 13/32]
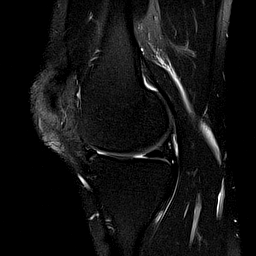
[im 19/32]
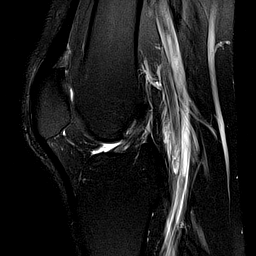
[im 25/32]
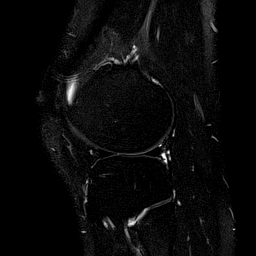
[im 32/32]
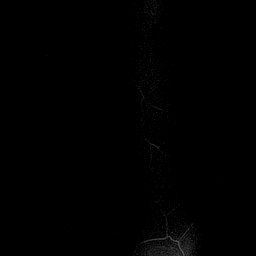

[Series 9: PD fat-sat · oblique · 2.0mm · 0.62mm/px · 4 of 19 slices shown (3 of 3)]
[im 1/19]
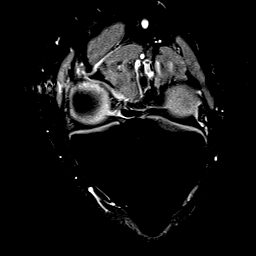
[im 7/19]
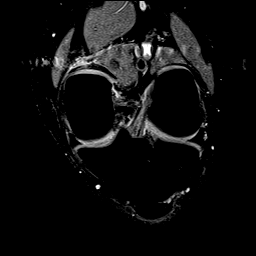
[im 13/19]
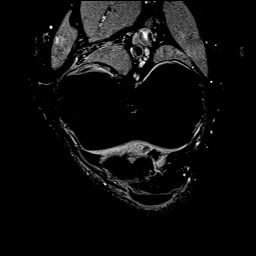
[im 19/19]
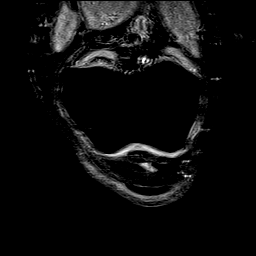

[40 of 40 positions shown; findings below may reference images not displayed]

FINDINGS: MENISCI

Medial meniscus:  Normal.

Lateral meniscus:  Normal.

LIGAMENTS

Cruciates:  Normal.

Collaterals:  Normal.

CARTILAGE

Patellofemoral:  Normal.

Medial:  Normal.

Lateral:  Normal.

Joint: Trace joint effusion. Normal Hoffa's fat pad. Tiny amount of
fluid in the deep infrapatellar bursa. Abnormal edema in the distal
quadriceps fat pad adjacent to the upper pole of the patella.

Popliteal Fossa: Tiny leaking Baker's cyst. Extravasated fluid
extends distally along the medial head of the gastrocnemius and
proximally adjacent to the origin of the medial head of the
gastrocnemius.

Extensor Mechanism: Distal quadriceps tendon and patellar tendon are
normal. There is nonspecific soft tissue edema overlying medial
aspect of the patella without definable bursitis.

Bones:  Normal.

Other: None
IMPRESSION: 1. Soft tissue swelling and edema anterior to the medial aspect of
the patella and proximal patellar tendon. Has the patient had
previous prepatellar bursitis? Does the patient work on his knees?
2. Edema in the distal quadriceps fat pad deep to the distal
quadriceps tendon at the upper pole of the patella. This is
typically associated with impingement upon the fat pad.
3. Small leaking Baker's cyst.

## 2020-11-05 ENCOUNTER — Telehealth: Payer: Self-pay

## 2020-11-05 NOTE — Telephone Encounter (Signed)
Transition Care Management Unsuccessful Follow-up Telephone Call  Date of discharge and from where:  11/04/2020 from [facility]  Attempts:  1st Attempt  Reason for unsuccessful TCM follow-up call:  Left voice message    

## 2020-11-07 NOTE — Telephone Encounter (Signed)
Transition Care Management Unsuccessful Follow-up Telephone Call  Date of discharge and from where:  11/04/2020 from [facility]  Attempts:  2nd Attempt  Reason for unsuccessful TCM follow-up call:  Unable to leave message

## 2020-11-08 NOTE — Telephone Encounter (Signed)
Transition Care Management Unsuccessful Follow-up Telephone Call  Date of discharge and from where:  11/04/2020 from [facility]  Attempts:  3rd Attempt  Reason for unsuccessful TCM follow-up call:  Unable to reach patient    

## 2021-01-27 ENCOUNTER — Telehealth: Payer: Self-pay | Admitting: General Practice

## 2021-01-27 NOTE — Telephone Encounter (Signed)
Transition Care Management Unsuccessful Follow-up Telephone Call  Date of discharge and from where:  01/27/21 from [facility]  Attempts:  1st Attempt  Reason for unsuccessful TCM follow-up call:  Left voice message

## 2021-01-28 ENCOUNTER — Telehealth: Payer: Self-pay

## 2021-01-28 NOTE — Telephone Encounter (Signed)
Transition Care Management Unsuccessful Follow-up Telephone Call  Date of discharge and from where:  01/27/2021 from [facility]  Attempts:  2nd Attempt  Reason for unsuccessful TCM follow-up call:  Left voice message

## 2021-02-03 NOTE — Telephone Encounter (Signed)
Transition Care Management Unsuccessful Follow-up Telephone Call  Date of discharge and from where:  01/27/2021 from [facility]  Attempts:  3rd Attempt  Reason for unsuccessful TCM follow-up call:  Unable to reach patient
